# Patient Record
Sex: Female | Born: 1955 | Race: White | Hispanic: No | Smoking: Never smoker
Health system: Southern US, Community
[De-identification: ages and names within clinical notes are randomized; demographics above are authoritative.]

## PROBLEM LIST (undated history)

## (undated) DIAGNOSIS — N3010 Interstitial cystitis (chronic) without hematuria: Secondary | ICD-10-CM

## (undated) DIAGNOSIS — E785 Hyperlipidemia, unspecified: Secondary | ICD-10-CM

## (undated) DIAGNOSIS — T8859XA Other complications of anesthesia, initial encounter: Secondary | ICD-10-CM

## (undated) DIAGNOSIS — Z9889 Other specified postprocedural states: Secondary | ICD-10-CM

## (undated) DIAGNOSIS — M1990 Unspecified osteoarthritis, unspecified site: Secondary | ICD-10-CM

## (undated) DIAGNOSIS — R112 Nausea with vomiting, unspecified: Secondary | ICD-10-CM

## (undated) DIAGNOSIS — E039 Hypothyroidism, unspecified: Secondary | ICD-10-CM

## (undated) DIAGNOSIS — R519 Headache, unspecified: Secondary | ICD-10-CM

## (undated) DIAGNOSIS — N3289 Other specified disorders of bladder: Secondary | ICD-10-CM

## (undated) DIAGNOSIS — R42 Dizziness and giddiness: Secondary | ICD-10-CM

## (undated) DIAGNOSIS — T4145XA Adverse effect of unspecified anesthetic, initial encounter: Secondary | ICD-10-CM

## (undated) HISTORY — PX: ABDOMINAL HYSTERECTOMY: SHX81

## (undated) HISTORY — PX: RECTOVAGINAL FISTULA CLOSURE: SUR265

## (undated) HISTORY — PX: APPENDECTOMY: SHX54

## (undated) HISTORY — PX: BLADDER SURGERY: SHX569

## (undated) HISTORY — PX: INTERSTIM IMPLANT PLACEMENT: SHX5130

---
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
## 2005-02-18 ENCOUNTER — Ambulatory Visit (HOSPITAL_BASED_OUTPATIENT_CLINIC_OR_DEPARTMENT_OTHER): Admission: RE | Admit: 2005-02-18 | Discharge: 2005-02-18 | Payer: Self-pay | Admitting: Urology

## 2005-03-04 ENCOUNTER — Ambulatory Visit (HOSPITAL_BASED_OUTPATIENT_CLINIC_OR_DEPARTMENT_OTHER): Admission: RE | Admit: 2005-03-04 | Discharge: 2005-03-04 | Payer: Self-pay | Admitting: Urology

## 2014-01-02 ENCOUNTER — Ambulatory Visit: Payer: Self-pay

## 2014-01-02 LAB — URINALYSIS, COMPLETE
BILIRUBIN, UR: NEGATIVE
Blood: NEGATIVE
GLUCOSE, UR: NEGATIVE mg/dL (ref 0–75)
KETONE: NEGATIVE
NITRITE: NEGATIVE
Ph: 6 (ref 4.5–8.0)
Protein: NEGATIVE
RBC,UR: NONE SEEN /HPF (ref 0–5)
Specific Gravity: 1.025 (ref 1.003–1.030)

## 2015-02-08 ENCOUNTER — Ambulatory Visit: Payer: Self-pay | Admitting: Family Medicine

## 2015-05-15 ENCOUNTER — Ambulatory Visit
Admission: EM | Admit: 2015-05-15 | Discharge: 2015-05-15 | Disposition: A | Discharge status: Home / Self Care | Payer: No Typology Code available for payment source | Attending: Family Medicine | Admitting: Family Medicine

## 2015-05-15 DIAGNOSIS — R21 Rash and other nonspecific skin eruption: Secondary | ICD-10-CM

## 2015-05-15 DIAGNOSIS — I776 Arteritis, unspecified: Secondary | ICD-10-CM

## 2015-05-15 HISTORY — DX: Other specified disorders of bladder: N32.89

## 2015-05-15 HISTORY — DX: Hyperlipidemia, unspecified: E78.5

## 2015-05-15 MED ORDER — RANITIDINE HCL 150 MG PO CAPS: 150.0000 mg | ORAL_CAPSULE | Freq: Two times a day (BID) | ORAL | Status: DC

## 2015-05-15 MED ORDER — LORATADINE 10 MG PO TABS: 10.0000 mg | ORAL_TABLET | Freq: Every day | ORAL | Status: DC

## 2015-05-15 MED ORDER — METHYLPREDNISOLONE SODIUM SUCC 125 MG IJ SOLR
125.0000 mg | Freq: Once | INTRAMUSCULAR | Status: AC
  Administered 2015-05-15: 125 mg via INTRAMUSCULAR

## 2015-05-15 MED ORDER — CETIRIZINE HCL 10 MG PO TABS: 10.0000 mg | ORAL_TABLET | Freq: Every day | ORAL | Status: DC

## 2015-05-15 MED ORDER — PREDNISONE 10 MG (21) PO TBPK: ORAL_TABLET | ORAL | Status: DC

## 2015-05-15 NOTE — ED Provider Notes (Signed)
CSN: [id number]     Arrival date & time 05/15/15  1753 History   First MD Initiated Contact with Patient 05/15/15 1919     Chief Complaint  Patient presents with  . Rash   (Consider location/radiation/quality/duration/timing/severity/associated sxs/prior Treatment) Patient is a 59 y.o. female presenting with rash. The history is provided by the patient. No language interpreter was used.  Rash Location:  Torso and shoulder/arm Torso rash location:  Abd LLQ, abd RLQ, R flank and R chest Quality: bruising, itchiness and redness   Severity:  Severe Onset quality:  Sudden Duration:  3 days Progression:  Worsening Chronicity:  New Context: medications   Relieved by:  Nothing Ineffective treatments:  Antihistamines Associated symptoms: joint pain   Associated symptoms: no fatigue, no fever, no myalgias and no shortness of breath    She reports being diagnosed with shingles on May 3. Sounds like she was placed on acyclovir since it's taken 4 times a day and she missed a few doses a lot of taking it for about 12 days. She just got through with acyclovir on the 14th. She states the shingles-like lesions on her right flank started bothering her about 3 days ago which would be about the 14th but she just thought that there was coming from a late reaction from the shingles. She start itching and scratching her abdomen her chest and her arms and she noticed bruises over her lower abdomen. She is use Benadryl Bacillus Benadryl wears off she has what sounds as if it is a rebound phenomena with more itching that she had before. Past Medical History  Diagnosis Date  . Lipidemia   . Bladder spasms    Past Surgical History  Procedure Laterality Date  . Abdominal hysterectomy     History reviewed. No pertinent family history. History  Substance Use Topics  . Smoking status: Never Smoker   . Smokeless tobacco: Not on file  . Alcohol Use: No   OB History    No data available     Review of  Systems  Constitutional: Negative for fever and fatigue.  Respiratory: Negative for shortness of breath.   Musculoskeletal: Positive for arthralgias. Negative for myalgias.  Skin: Positive for rash.  All other systems reviewed and are negative.   Allergies  Review of patient's allergies indicates no known allergies.  Home Medications   Prior to Admission medications   Medication Sig Start Date End Date Taking? Authorizing Provider  atorvastatin (LIPITOR) 40 MG tablet Take 40 mg by mouth daily.   Yes Historical Provider, MD  zolpidem (AMBIEN) 10 MG tablet Take 10 mg by mouth at bedtime as needed for sleep.   Yes Historical Provider, MD  cetirizine (ZYRTEC) 10 MG tablet Take 1 tablet (10 mg total) by mouth daily. If needed at night for itching not relieved by Claritin in the morning. 05/15/15   Hassan RowanEugene Avy Barlett, MD  loratadine (CLARITIN) 10 MG tablet Take 1 tablet (10 mg total) by mouth daily. Take 1 tablet in the morning. As needed for itching. 05/15/15   Hassan RowanEugene Greysen Devino, MD  predniSONE (STERAPRED UNI-PAK 21 TAB) 10 MG (21) TBPK tablet All pills to be taken orally. 6 tablets day 1, 5 tablets daily 2, 4 tablets day 3, 3 tablets day 4, 2 tablets day 5, 1 tablet day 6 day 05/15/15   Hassan RowanEugene Aylee Littrell, MD  ranitidine (ZANTAC) 150 MG capsule Take 1 capsule (150 mg total) by mouth 2 (two) times daily. 05/15/15   Hassan RowanEugene Adelaine Roppolo, MD  BP 142/80 mmHg  Pulse 78  Temp(Src) 98.3 F (36.8 C) (Tympanic)  Resp 16  Ht 5' (1.524 m)  Wt 200 lb (90.719 kg)  BMI 39.06 kg/m2  SpO2 99%  LMP  Physical Exam  Constitutional: She is oriented to person, place, and time. She appears well-developed and well-nourished.  Obese white female  HENT:  Head: Normocephalic and atraumatic.  Cardiovascular: Normal rate and regular rhythm.   Pulmonary/Chest: Effort normal and breath sounds normal.  Abdominal:  Bruising present over the lower abdomen.  Neurological: She is oriented to person, place, and time.  Skin: Rash noted. There is  erythema.     Vitals reviewed.   ED Course  Procedures (including critical care time) Labs Review Labs Reviewed - No data to display  Imaging Review No results found. Patient looks as if she has a vasculitis. I think the bruising of the abdomen is coming from her scratching the vasculitis.  I have explained to her this may be a reaction to acyclovir or could be some type of late reaction to the shingles. Will treat with a dose of Solu-Medrol here. Prednisone for 6 days H2-blocker Zantac twice a day, H1 blocker Claritin in the a.m. and repeat with Zyrtec if needed.  MDM   1. Vasculitis   2. Rash and nonspecific skin eruption        Hassan RowanEugene Leaman Abe, MD 05/15/15 2231

## 2015-05-15 NOTE — Discharge Instructions (Signed)
Allergies °Allergies may happen from anything your body is sensitive to. This may be food, medicines, pollens, chemicals, and nearly anything around you in everyday life that produces allergens. An allergen is anything that causes an allergy producing substance. Heredity is often a factor in causing these problems. This means you may have some of the same allergies as your parents. °Food allergies happen in all age groups. Food allergies are some of the most severe and life threatening. Some common food allergies are cow's milk, seafood, eggs, nuts, wheat, and soybeans. °SYMPTOMS  °· Swelling around the mouth. °· An itchy red rash or hives. °· Vomiting or diarrhea. °· Difficulty breathing. °SEVERE ALLERGIC REACTIONS ARE LIFE-THREATENING. °This reaction is called anaphylaxis. It can cause the mouth and throat to swell and cause difficulty with breathing and swallowing. In severe reactions only a trace amount of food (for example, peanut oil in a salad) may cause death within seconds. °Seasonal allergies occur in all age groups. These are seasonal because they usually occur during the same season every year. They may be a reaction to molds, grass pollens, or tree pollens. Other causes of problems are house dust mite allergens, pet dander, and mold spores. The symptoms often consist of nasal congestion, a runny itchy nose associated with sneezing, and tearing itchy eyes. There is often an associated itching of the mouth and ears. The problems happen when you come in contact with pollens and other allergens. Allergens are the particles in the air that the body reacts to with an allergic reaction. This causes you to release allergic antibodies. Through a chain of events, these eventually cause you to release histamine into the blood stream. Although it is meant to be protective to the body, it is this release that causes your discomfort. This is why you were given anti-histamines to feel better.  If you are unable to  pinpoint the offending allergen, it may be determined by skin or blood testing. Allergies cannot be cured but can be controlled with medicine. °Hay fever is a collection of all or some of the seasonal allergy problems. It may often be treated with simple over-the-counter medicine such as diphenhydramine. Take medicine as directed. Do not drink alcohol or drive while taking this medicine. Check with your caregiver or package insert for child dosages. °If these medicines are not effective, there are many new medicines your caregiver can prescribe. Stronger medicine such as nasal spray, eye drops, and corticosteroids may be used if the first things you try do not work well. Other treatments such as immunotherapy or desensitizing injections can be used if all else fails. Follow up with your caregiver if problems continue. These seasonal allergies are usually not life threatening. They are generally more of a nuisance that can often be handled using medicine. °HOME CARE INSTRUCTIONS  °· If unsure what causes a reaction, keep a diary of foods eaten and symptoms that follow. Avoid foods that cause reactions. °· If hives or rash are present: °¨ Take medicine as directed. °¨ You may use an over-the-counter antihistamine (diphenhydramine) for hives and itching as needed. °¨ Apply cold compresses (cloths) to the skin or take baths in cool water. Avoid hot baths or showers. Heat will make a rash and itching worse. °· If you are severely allergic: °¨ Following a treatment for a severe reaction, hospitalization is often required for closer follow-up. °¨ Wear a medic-alert bracelet or necklace stating the allergy. °¨ You and your family must learn how to give adrenaline or use   an anaphylaxis kit.  If you have had a severe reaction, always carry your anaphylaxis kit or EpiPen with you. Use this medicine as directed by your caregiver if a severe reaction is occurring. Failure to do so could have a fatal outcome. SEEK MEDICAL  CARE IF:  You suspect a food allergy. Symptoms generally happen within 30 minutes of eating a food.  Your symptoms have not gone away within 2 days or are getting worse.  You develop new symptoms.  You want to retest yourself or your child with a food or drink you think causes an allergic reaction. Never do this if an anaphylactic reaction to that food or drink has happened before. Only do this under the care of a caregiver. SEEK IMMEDIATE MEDICAL CARE IF:   You have difficulty breathing, are wheezing, or have a tight feeling in your chest or throat.  You have a swollen mouth, or you have hives, swelling, or itching all over your body.  You have had a severe reaction that has responded to your anaphylaxis kit or an EpiPen. These reactions may return when the medicine has worn off. These reactions should be considered life threatening. MAKE SURE YOU:   Understand these instructions.  Will watch your condition.  Will get help right away if you are not doing well or get worse. Document Released: 03/10/2003 Document Revised: 04/11/2013 Document Reviewed: 08/14/2008 [facility] Patient Information 2015 [place], [place]. This information is not intended to replace advice given to you by your health care provider. Make sure you discuss any questions you have with your health care provider.  Vasculitis Vasculitis is when your blood vessels are inflamed. There are many different blood vessels in the body, and vasculitis can affect any of them. This includes large (veins and arteries) and small (capillaries) vessels. With vasculitis,   Blood vessel walls can become thick.  Blood vessels can become narrow.  Blood vessels can become weak. Sometimes, it becomes so weak that the blood vessel bulges out like a balloon. This is called an aneurysm. Aneurysms are rare but can be life-threatening.  Scarring can occur.  Not enough blood can flow through the blood vessels. All of these things can  damage many parts of the body, including the muscles, kidneys, lungs and brain. There are many types of vasculitis. Some types are short-term (acute), while others are long-term (chronic). Some types may go away without treatment, and others may need to be treated for a long time.  CAUSES  Vasculitis occurs when the body's immune system (which fights germs and disease) makes a mistake. It attacks its own blood vessels. This causes inflammation (the body's way of reacting to injury or infection).   Why this happens is usually not known. The condition is then called primary vasculitis.  Sometimes, something triggers the inflammation. This is called secondary vasculitis. Possible causes include:  Infections.  An immune system disease. Examples include lupus, rheumatoid arthritis and scleroderma.  An allergic reaction to a medicine.  Cancer that affects blood cells. This includes leukemia and lymphoma.  Males and females of all ages and races can develop vasculitis. Some risk factors make vasculitis more likely. These include:  Smoking.  Stress.  Physical injury. SYMPTOMS  There are more than 20 types of vasculitis. Symptoms of each type vary, but some symptoms are common.  Many people with vasculitis:  Have a fever.  Do not feel like eating.  Lose weight.  Feel very tired.  Have aches and pains.  Feel weak.  Start to not have feeling (numbness) in an area.  Symptoms for some types of vasculitis also could be:  Sores in the mouth or eyes.  Skin problems. This could be sores, spots or rashes.  Trouble seeing.  Trouble breathing.  Blood in the urine.  Headaches.  Pain in the abdomen.  Stuffy or bloody nose. DIAGNOSIS  Vasculitis symptoms are similar to symptoms of many other conditions. That can make it hard to tell if you have vasculitis. To be sure, your caregiver will ask about your symptoms and do a physical exam. Certain tests may be necessary, such as:    A complete blood count (CBC). This test shows how many red blood cells are in your blood. Not having enough red blood cells (anemic) can result from vasculitis.  Erythrocyte sedimentation (also called sed rate test). It measures inflammation in the body.  C reactive protein (CRP). This also shows if there is inflammation.  Anti-neutrophil cytoplasmic antibodies (ANCA). This can tell if the immune system is reacting to certain cells in the blood.  A urine test. This checks for blood or protein in the urine. That could be a sign of kidney damage from vasculitis.  Imaging tests. These tests create pictures from inside the body. Options include:  X-rays.  Computed tomography (CT) scan. This uses X-rays guided by a computer.  Ultrasound. It creates an image using sound waves.  Magnetic resonance imaging (MRI). It uses radio waves, magnets and a computer.  Angiography. A dye is put into your blood vessels. Then, an X-ray is taken of them.  A biopsy of a blood vessel. This means your caregiver will take out a small piece of a blood vessel. Then, it is checked under a microscope. This is an important test. It often is the best way to know for sure if you have vasculitis. TREATMENT   Treatment will depend on the type of vasculitis and how severe it is. Often, you will need to see a specialist in immunologic diseases (rheumatologist).  Some types of vasculitis may go away without treatment.  Some types need only over-the-counter drugs.  Prescription medicines are used to treat many types of vasculitis. For example:  Corticosteroids. These are the drugs used most often. They are very powerful. Usually, a high dose is taken until symptoms improve. Then, the dose is gradually decreased. Using corticosteroids for a long time can cause problems. They can make muscles and bones weak. They can cause blood pressure to go up, and cause diabetes. Also, people often gain weight when they take  corticosteroids.  Cytotoxic drugs. These kill cells that cause inflammation. Sometimes, they are used if corticosteroids do not help. Other times, both medications are taken.  Surgery. This may be needed to repair a blood vessel that has bulged out (aneurysm).  Treatment can sometimes cure your disease. Other times, it can put the disease in remission (no symptoms). Increased treatment and reevaluation might be necessary if your disease comes back or flares. HOME CARE INSTRUCTIONS   Take any medications that your caregiver prescribes. Follow the directions carefully.  Watch for any problems that can be caused by a drug (side effects). Tell your caregiver right away if you notice any changes or problems.  Keep all appointments for checkups. This is important to help your caregiver watch for side effects. Checkups may include:  Periodic blood tests.  Bone density testing. This checks how strong or weak your bones are.  Blood pressure checks. If your blood pressure rises,  you may need to take a drug to control it while you are taking corticosteroids.  Blood sugar checks. This is to be sure you are not developing diabetes. If you have diabetes, corticosteroid medications may make it worse and require increased treatment.  Exercise. First, talk with your caregiver about what would be OK for you to do. Aerobic exercise (which increases your heart rate) is often suggested. It includes walking. This type of exercise is good because it helps prevent bone loss. It also helps control your blood pressure.  Follow a healthy diet. Include good sources of protein in your diet. Also include fruits, vegetables and whole grains. Your caregiver can refer you to an expert on healthy eating (dietitian) for more detailed advice.  Learn as much as you can about vasculitis. Understanding your condition can help you cope with it. Coping can be hard because this may be something you will have to live with for  years.  Consider joining a support group. It often helps to talk about your worries with others who have the same problems.  Tell your caregiver if you feel stressed, anxious or depressed. Your caregiver may refer you to a specialist, or recommend medication to relieve your symptoms. SEEK MEDICAL CARE IF:   The symptoms that led to your diagnosis return.  You develop worsening fever, fatigue, headache, weight loss or pain in your jaw.  You develop signs of infection. Infections can be worse if you are on corticosteroid medication.  You develop any new or unexplained symptoms of disease. SEEK IMMEDIATE MEDICAL CARE IF:   Your eyesight changes.  Pain does not go away, even after taking medication.  You feel pain in your chest or abdomen.  You have trouble breathing.  One side of your face or body becomes suddenly weak or numb.  Your nose bleeds.  There is blood in your urine.  You develop a fever of more than 102 F (38.9 C). Document Released: 10/11/2009 Document Revised: 03/08/2012 Document Reviewed: 02/08/2014 [facility] Patient Information 2015 [place], [place]. This information is not intended to replace advice given to you by your health care provider. Make sure you discuss any questions you have with your health care provider.

## 2015-05-15 NOTE — ED Notes (Signed)
Pt states "I was treated for shingles 3 weeks ago, I completed course of Valtrex and now 4 days I have this rash all over, it is very itchy. I have changed to all skin sensitive soaps, but the rash continues to get worse."

## 2016-08-16 ENCOUNTER — Ambulatory Visit
Admission: EM | Admit: 2016-08-16 | Discharge: 2016-08-16 | Disposition: A | Discharge status: Home / Self Care | Payer: Commercial Managed Care - PPO | Attending: Family Medicine | Admitting: Family Medicine

## 2016-08-16 ENCOUNTER — Ambulatory Visit (INDEPENDENT_AMBULATORY_CARE_PROVIDER_SITE_OTHER): Payer: Commercial Managed Care - PPO

## 2016-08-16 DIAGNOSIS — S92901A Unspecified fracture of right foot, initial encounter for closed fracture: Secondary | ICD-10-CM | POA: Diagnosis not present

## 2016-08-16 MED ORDER — OXYCODONE-ACETAMINOPHEN 5-325 MG PO TABS: 1.0000 | ORAL_TABLET | Freq: Four times a day (QID) | ORAL | 0 refills | Status: DC | PRN

## 2016-08-16 NOTE — ED Triage Notes (Signed)
Patient complains of right foot pain. Patient states that she was taking her dogs outside around midnight and overturned her foot on a paver outside of her house. Patient states that she now has right foot pain that is described as sharp.

## 2016-08-16 NOTE — Discharge Instructions (Signed)
Patient will need to follow-up with podiatry or orthopedic foot/ankle.

## 2016-08-16 NOTE — ED Provider Notes (Signed)
[facility]    CSN: [id number] Arrival date & time: 08/16/16  [id number]  First Provider Contact:  None       History   Chief Complaint Chief Complaint  Patient presents with  . Foot Pain    Right    HPI Stacey Parkeratricia Henson Orvan Henson is a 60 y.o. female.   HPI: Patient presents today with right foot pain. Patient states that she was walking her dog when she had an inversion injury to her foot. She denies any other injury. She denies any history of fracture to her right foot in the past. Patient denies any ankle pain.  Past Medical History:  Diagnosis Date  . Bladder spasms   . Lipidemia     There are no active problems to display for this patient.   Past Surgical History:  Procedure Laterality Date  . ABDOMINAL HYSTERECTOMY      OB History    No data available       Home Medications    Prior to Admission medications   Medication Sig Start Date End Date Taking? Authorizing Provider  atorvastatin (LIPITOR) 40 MG tablet Take 40 mg by mouth daily.   Yes Historical Provider, MD  cetirizine (ZYRTEC) 10 MG tablet Take 1 tablet (10 mg total) by mouth daily. If needed at night for itching not relieved by Claritin in the morning. 05/15/15  Yes Hassan RowanEugene Wade, MD  levothyroxine (SYNTHROID, LEVOTHROID) 50 MCG tablet Take 50 mcg by mouth daily before breakfast.   Yes Historical Provider, MD  loratadine (CLARITIN) 10 MG tablet Take 1 tablet (10 mg total) by mouth daily. Take 1 tablet in the morning. As needed for itching. 05/15/15  Yes Hassan RowanEugene Wade, MD  ranitidine (ZANTAC) 150 MG capsule Take 1 capsule (150 mg total) by mouth 2 (two) times daily. 05/15/15  Yes Hassan RowanEugene Wade, MD  zolpidem (AMBIEN) 10 MG tablet Take 10 mg by mouth at bedtime as needed for sleep.   Yes Historical Provider, MD  predniSONE (STERAPRED UNI-PAK 21 TAB) 10 MG (21) TBPK tablet All pills to be taken orally. 6 tablets day 1, 5 tablets daily 2, 4 tablets day 3, 3 tablets day 4, 2 tablets day 5, 1 tablet day 6 day  05/15/15   Hassan RowanEugene Wade, MD    Family History Family History  Problem Relation Age of Onset  . Cancer Mother     Social History Social History  Substance Use Topics  . Smoking status: Never Smoker  . Smokeless tobacco: Never Used  . Alcohol use No     Allergies   Review of patient's allergies indicates no known allergies.   Review of Systems Review of Systems   Physical Exam Triage Vital Signs ED Triage Vitals  Enc Vitals Group     BP 08/16/16 0855 123/88     Pulse Rate 08/16/16 0855 100     Resp 08/16/16 0855 16     Temp 08/16/16 0855 97.5 F (36.4 C)     Temp Source 08/16/16 0855 Tympanic     SpO2 08/16/16 0855 97 %     Weight 08/16/16 0856 210 lb (95.3 kg)     Height 08/16/16 0856 5' (1.524 m)     Head Circumference --      Peak Flow --      Pain Score 08/16/16 0859 8     Pain Loc --      Pain Edu? --      Excl. in GC? --    No  data found.   Updated Vital Signs BP 123/88 (BP Location: Left Arm)   Pulse 100   Temp 97.5 F (36.4 C) (Tympanic)   Resp 16   Ht 5' (1.524 m)   Wt 210 lb (95.3 kg)   SpO2 97%   BMI 41.01 kg/m      Physical Exam:  GENERAL: NAD RESP: CTA B CARD: RRR MSK: Right Foot- moderate swelling of the dorsal aspect of the foot, there is some mild ecchymosis, there is tenderness at the distal aspect of the foot, there is some tenderness along the arch of the foot as well, there is no tenderness at the insertion of the plantar fascia at the calcaneus, decreased range of motion due to swelling and discomfort, NV intact NEURO: CN II-XII groslly intact    UC Treatments / Results  Labs (all labs ordered are listed, but only abnormal results are displayed) Labs Reviewed - No data to display  EKG  EKG Interpretation None       Radiology No results found.  Procedures Procedures (including critical care time)  Medications Ordered in UC Medications - No data to display   Initial Impression / Assessment and Plan / UC Course   I have reviewed the triage vital signs and the nursing notes.  Pertinent labs & imaging results that were available during my care of the patient were reviewed by me and considered in my medical decision making (see chart for details).  Clinical Course  A/P: Right foot fracture- discussed results with patient, will place patient in walking boot and crutches, patient will need to follow-up with podiatry and or orthopedic foot and ankle. Patient encouraged on rest, ice, elevation, compression. NSAIDs when necessary, Percocet when necessary severe pain.  Final Clinical Impressions(s) / UC Diagnoses   Final diagnoses:  None    New Prescriptions New Prescriptions   No medications on file     Jolene ProvostKirtida Buddie Marston, MD 08/16/16 1023

## 2019-02-01 ENCOUNTER — Ambulatory Visit
Admission: EM | Admit: 2019-02-01 | Discharge: 2019-02-01 | Disposition: A | Discharge status: Home / Self Care | Payer: Commercial Managed Care - PPO | Attending: Family Medicine | Admitting: Family Medicine

## 2019-02-01 DIAGNOSIS — J0110 Acute frontal sinusitis, unspecified: Secondary | ICD-10-CM | POA: Diagnosis not present

## 2019-02-01 MED ORDER — AMOXICILLIN-POT CLAVULANATE 875-125 MG PO TABS: 1.0000 | ORAL_TABLET | Freq: Two times a day (BID) | ORAL | 0 refills | Status: DC

## 2019-02-01 NOTE — ED Triage Notes (Signed)
Pt states she thinks she has a sinus infection for the past 2 weeks. Cannot lay flat due to the pressure in her face and head. Also states drainage in the back of her throat but no nasal discharge. Has been taking otc cold medication without relief.

## 2019-02-01 NOTE — ED Provider Notes (Signed)
[facility]    CSN: [id number] Arrival date & time: 02/01/19  1713  History   Chief Complaint Chief Complaint  Patient presents with  . Sinusitis   HPI  63 year old female presents with concerns for sinusitis.   2 week history of sinus pressure, headache, congestion.  Located in the frontal region.  Associated postnasal drip.  She has used over-the-counter cold and sinus medication without improvement.  No known exacerbating factors.  No fever.  No other associated symptoms.  No other complaints.  PMH, Surgical Hx, Family Hx, Social History reviewed and updated as below.  PMH:  Interstitial cystitis Migraine Insomnia Hyperlipidemia  Past Surgical History:  Procedure Laterality Date  . ABDOMINAL HYSTERECTOMY     OB History   No obstetric history on file.    Home Medications    Prior to Admission medications   Medication Sig Start Date End Date Taking? Authorizing Provider  amitriptyline (ELAVIL) 50 MG tablet  01/25/19  Yes [provider]  atorvastatin (LIPITOR) 40 MG tablet Take 40 mg by mouth daily.   Yes [provider]  cetirizine (ZYRTEC) 10 MG tablet Take 1 tablet (10 mg total) by mouth daily. If needed at night for itching not relieved by Claritin in the morning. 05/15/15  Yes Hassan Rowan, MD  levothyroxine (SYNTHROID, LEVOTHROID) 50 MCG tablet Take 50 mcg by mouth daily before breakfast.   Yes [provider]  loratadine (CLARITIN) 10 MG tablet Take 1 tablet (10 mg total) by mouth daily. Take 1 tablet in the morning. As needed for itching. 05/15/15  Yes Hassan Rowan, MD  zolpidem (AMBIEN) 10 MG tablet Take 10 mg by mouth at bedtime as needed for sleep.   Yes [provider]  amoxicillin-clavulanate (AUGMENTIN) 875-125 MG tablet Take 1 tablet by mouth every 12 (twelve) hours. 02/01/19   Tommie Sams, DO  oxyCODONE-acetaminophen (PERCOCET/ROXICET) 5-325 MG tablet Take 1 tablet by mouth every 6 (six) hours as needed for  severe pain. 08/16/16   Jolene Provost, MD  ranitidine (ZANTAC) 150 MG capsule Take 1 capsule (150 mg total) by mouth 2 (two) times daily. 05/15/15   Hassan Rowan, MD   Family History Family History  Problem Relation Age of Onset  . Cancer Mother    Social History Social History   Tobacco Use  . Smoking status: Never Smoker  . Smokeless tobacco: Never Used  Substance Use Topics  . Alcohol use: No  . Drug use: No   Allergies   Patient has no known allergies.  Review of Systems Review of Systems  Constitutional: Negative for fever.  HENT: Positive for congestion, postnasal drip, sinus pressure and sinus pain.    Physical Exam Triage Vital Signs ED Triage Vitals  Enc Vitals Group     BP 02/01/19 1803 129/73     Pulse Rate 02/01/19 1803 93     Resp 02/01/19 1803 18     Temp 02/01/19 1803 98.2 F (36.8 C)     Temp Source 02/01/19 1803 Oral     SpO2 02/01/19 1803 98 %     Weight 02/01/19 1804 230 lb (104.3 kg)     Height 02/01/19 1804 5' (1.524 m)     Head Circumference --      Peak Flow --      Pain Score 02/01/19 1804 5     Pain Loc --      Pain Edu? --      Excl. in GC? --  Updated Vital Signs BP 129/73 (BP Location: Left Arm)   Pulse 93   Temp 98.2 F (36.8 C) (Oral)   Resp 18   Ht 5' (1.524 m)   Wt 104.3 kg   SpO2 98%   BMI 44.92 kg/m   Visual Acuity Right Eye Distance:   Left Eye Distance:   Bilateral Distance:    Right Eye Near:   Left Eye Near:    Bilateral Near:     Physical Exam Vitals signs and nursing note reviewed.  Constitutional:      Appearance: Normal appearance.  HENT:     Head: Normocephalic and atraumatic.     Right Ear: Tympanic membrane normal.     Left Ear: Tympanic membrane normal.     Nose: Nose normal.     Comments: Frontal sinus tenderness to palpation.    Mouth/Throat:     Pharynx: Oropharynx is clear. No posterior oropharyngeal erythema.  Eyes:     General:        Right eye: No discharge.        Left eye: No  discharge.     Conjunctiva/sclera: Conjunctivae normal.  Cardiovascular:     Rate and Rhythm: Normal rate and regular rhythm.  Pulmonary:     Effort: Pulmonary effort is normal.     Breath sounds: Normal breath sounds.  Neurological:     Mental Status: She is alert.  Psychiatric:        Mood and Affect: Mood normal.        Behavior: Behavior normal.    UC Treatments / Results  Labs (all labs ordered are listed, but only abnormal results are displayed) Labs Reviewed - No data to display  EKG None  Radiology No results found.  Procedures Procedures (including critical care time)  Medications Ordered in UC Medications - No data to display  Initial Impression / Assessment and Plan / UC Course  I have reviewed the triage vital signs and the nursing notes.  Pertinent labs & imaging results that were available during my care of the patient were reviewed by me and considered in my medical decision making (see chart for details).    63 year old female presents with sinusitis.  Treating with Augmentin.  Final Clinical Impressions(s) / UC Diagnoses   Final diagnoses:  Acute frontal sinusitis, recurrence not specified   Discharge Instructions   None    ED Prescriptions    Medication Sig Dispense Auth. Provider   amoxicillin-clavulanate (AUGMENTIN) 875-125 MG tablet Take 1 tablet by mouth every 12 (twelve) hours. 14 tablet Tommie Samsook, Rosette Bellavance G, DO     Controlled Substance Prescriptions [place] Controlled Substance Registry consulted? Not Applicable   Tommie SamsCook, Syanna Remmert G, DO 02/01/19 1959

## 2019-04-16 ENCOUNTER — Ambulatory Visit
Admission: EM | Admit: 2019-04-16 | Discharge: 2019-04-16 | Disposition: A | Discharge status: Home / Self Care | Payer: Commercial Managed Care - PPO | Attending: Family Medicine | Admitting: Family Medicine

## 2019-04-16 ENCOUNTER — Ambulatory Visit (INDEPENDENT_AMBULATORY_CARE_PROVIDER_SITE_OTHER): Payer: Commercial Managed Care - PPO

## 2019-04-16 DIAGNOSIS — R0789 Other chest pain: Secondary | ICD-10-CM

## 2019-04-16 DIAGNOSIS — R0602 Shortness of breath: Secondary | ICD-10-CM

## 2019-04-16 DIAGNOSIS — S2232XA Fracture of one rib, left side, initial encounter for closed fracture: Secondary | ICD-10-CM

## 2019-04-16 MED ORDER — HYDROCODONE-ACETAMINOPHEN 5-325 MG PO TABS: 1.0000 | ORAL_TABLET | Freq: Three times a day (TID) | ORAL | 0 refills | Status: DC | PRN
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
## 2019-04-16 NOTE — Discharge Instructions (Signed)
Rest.  Pain medication as needed.  Take care  Dr. Lawren Sexson  

## 2020-03-23 ENCOUNTER — Other Ambulatory Visit: Payer: Self-pay | Admitting: Orthopedic Surgery

## 2020-03-23 DIAGNOSIS — M1712 Unilateral primary osteoarthritis, left knee: Secondary | ICD-10-CM

## 2020-04-02 ENCOUNTER — Ambulatory Visit
Admission: RE | Admit: 2020-04-02 | Discharge: 2020-04-02 | Disposition: A | Discharge status: Home / Self Care | Payer: Commercial Managed Care - PPO | Source: Ambulatory Visit | Attending: Orthopedic Surgery | Admitting: Orthopedic Surgery

## 2020-04-02 ENCOUNTER — Other Ambulatory Visit: Payer: Self-pay

## 2020-04-02 DIAGNOSIS — M1712 Unilateral primary osteoarthritis, left knee: Secondary | ICD-10-CM

## 2020-05-08 ENCOUNTER — Other Ambulatory Visit: Payer: Self-pay | Admitting: Orthopedic Surgery

## 2020-05-16 ENCOUNTER — Other Ambulatory Visit: Payer: Self-pay

## 2020-05-16 ENCOUNTER — Encounter
Admission: RE | Admit: 2020-05-16 | Discharge: 2020-05-16 | Disposition: A | Discharge status: Home / Self Care | Payer: Commercial Managed Care - PPO | Source: Ambulatory Visit | Attending: Orthopedic Surgery | Admitting: Orthopedic Surgery

## 2020-05-16 DIAGNOSIS — Z01818 Encounter for other preprocedural examination: Secondary | ICD-10-CM | POA: Insufficient documentation

## 2020-05-16 HISTORY — DX: Dizziness and giddiness: R42

## 2020-05-16 HISTORY — DX: Other specified postprocedural states: R11.2

## 2020-05-16 HISTORY — DX: Hypothyroidism, unspecified: E03.9

## 2020-05-16 HISTORY — DX: Unspecified osteoarthritis, unspecified site: M19.90

## 2020-05-16 HISTORY — DX: Interstitial cystitis (chronic) without hematuria: N30.10

## 2020-05-16 HISTORY — DX: Other complications of anesthesia, initial encounter: T88.59XA

## 2020-05-16 HISTORY — DX: Headache, unspecified: R51.9

## 2020-05-16 HISTORY — DX: Nausea with vomiting, unspecified: Z98.890

## 2020-05-16 NOTE — Patient Instructions (Signed)
Your procedure is scheduled on: 05-24-20 THURSDAY Report to Same Day Surgery 2nd floor medical mall [facility] Entrance-take elevator on left to 2nd floor.  Check in with surgery information desk.) To find out your arrival time please call [phone] between 1PM - 3PM on 05-23-20 [facility]  Remember: Instructions that are not followed completely may result in serious medical risk, up to and including death, or upon the discretion of your surgeon and anesthesiologist your surgery may need to be rescheduled.    _x___ 1. Do not eat food after midnight the night before your procedure. NO GUM OR CANDY AFTER MIDNIGHT. You may drink clear liquids up to 2 hours before you are scheduled to arrive at the hospital for your procedure.  Do not drink clear liquids within 2 hours of your scheduled arrival to the hospital.  Clear liquids include  --Water or Apple juice without pulp  --Gatorade  --Black Coffee or Clear Tea (No milk, no creamers, do not add anything to the coffee or Tea-sugar ok to add)   ____Ensure clear carbohydrate drink on the way to the hospital for bariatric patients  _X___Ensure clear carbohydrate drink-FINISH DRINK 2 HOURS PRIOR TO ARRIVAL TIME TO HOSPITAL DAY OF SURGERY    __x__ 2. No Alcohol for 24 hours before or after surgery.   __x__3. No Smoking or e-cigarettes for 24 prior to surgery.  Do not use any chewable tobacco products for at least 6 hour prior to surgery   ____  4. Bring all medications with you on the day of surgery if instructed.    __x__ 5. Notify your doctor if there is any change in your medical condition     (cold, fever, infections).    x___6. On the morning of surgery brush your teeth with toothpaste and water.  You may rinse your mouth with mouth wash if you wish.  Do not swallow any toothpaste or mouthwash.   Do not wear jewelry, make-up, hairpins, clips or nail polish.  Do not wear lotions, powders, or perfumes.  Do not shave 48 hours prior to  surgery. Men may shave face and neck.  Do not bring valuables to the hospital.    [facility] is not responsible for any belongings or valuables.               Contacts, dentures or bridgework may not be worn into surgery.  Leave your suitcase in the car. After surgery it may be brought to your room.  For patients admitted to the hospital, discharge time is determined by your treatment team.  _  Patients discharged the day of surgery will not be allowed to drive home.  You will need someone to drive you home and stay with you the night of your procedure.    Please read over the following fact sheets that you were given:   [facility] Preparing for Surgery and MRSA Information/INCENTIVE SPIROMETER INSTRUCTIONS  _x___ TAKE THE FOLLOWING MEDICATION THE MORNING OF SURGERY WITH A SMALL SIP OF WATER. These include:  1. SYNTHROID (LEVOTHYROXINE)  2. LIPITOR (ATORVASTATIN)  3.  4.  5.  6.  ____Fleets enema or Magnesium Citrate as directed.   _x___ Use CHG Soap or sage wipes as directed on instruction sheet   ____ Use inhalers on the day of surgery and bring to hospital day of surgery  ____ Stop Metformin and Janumet 2 days prior to surgery.    ____ Take 1/2 of usual insulin dose the night before surgery and  none on the morning surgery.   ____ Follow recommendations from Cardiologist, Pulmonologist or PCP regarding stopping Aspirin, Coumadin, Plavix ,Eliquis, Effient, or Pradaxa, and Pletal.  X____Stop Anti-inflammatories such as Advil, Aleve, Ibuprofen, Motrin, Naproxen, Naprosyn, Goodies powders or aspirin products NOW-OK to take Tylenol   ____ Stop supplements until after surgery.   ____ Bring C-Pap to the hospital.

## 2020-05-18 ENCOUNTER — Other Ambulatory Visit: Payer: Self-pay

## 2020-05-18 ENCOUNTER — Encounter
Admission: RE | Admit: 2020-05-18 | Discharge: 2020-05-18 | Disposition: A | Discharge status: Home / Self Care | Payer: Commercial Managed Care - PPO | Source: Ambulatory Visit | Attending: Orthopedic Surgery | Admitting: Orthopedic Surgery

## 2020-05-18 DIAGNOSIS — Z01818 Encounter for other preprocedural examination: Secondary | ICD-10-CM | POA: Diagnosis not present

## 2020-05-18 LAB — SURGICAL PCR SCREEN
MRSA, PCR: NEGATIVE
Staphylococcus aureus: NEGATIVE

## 2020-05-18 LAB — CBC WITH DIFFERENTIAL/PLATELET
Abs Immature Granulocytes: 0.01 10*3/uL (ref 0.00–0.07)
Basophils Absolute: 0.1 10*3/uL (ref 0.0–0.1)
Basophils Relative: 1 %
Eosinophils Absolute: 0.2 10*3/uL (ref 0.0–0.5)
Eosinophils Relative: 3 %
HCT: 40.2 % (ref 36.0–46.0)
Hemoglobin: 13.3 g/dL (ref 12.0–15.0)
Immature Granulocytes: 0 %
Lymphocytes Relative: 28 %
Lymphs Abs: 1.9 10*3/uL (ref 0.7–4.0)
MCH: 27.9 pg (ref 26.0–34.0)
MCHC: 33.1 g/dL (ref 30.0–36.0)
MCV: 84.3 fL (ref 80.0–100.0)
Monocytes Absolute: 0.4 10*3/uL (ref 0.1–1.0)
Monocytes Relative: 5 %
Neutro Abs: 4.2 10*3/uL (ref 1.7–7.7)
Neutrophils Relative %: 63 %
Platelets: 253 10*3/uL (ref 150–400)
RBC: 4.77 MIL/uL (ref 3.87–5.11)
RDW: 13.9 % (ref 11.5–15.5)
WBC: 6.7 10*3/uL (ref 4.0–10.5)
nRBC: 0 % (ref 0.0–0.2)

## 2020-05-18 LAB — URINALYSIS, ROUTINE W REFLEX MICROSCOPIC
Bilirubin Urine: NEGATIVE
Glucose, UA: NEGATIVE mg/dL
Hgb urine dipstick: NEGATIVE
Ketones, ur: NEGATIVE mg/dL
Nitrite: NEGATIVE
Protein, ur: NEGATIVE mg/dL
Specific Gravity, Urine: 1.005 (ref 1.005–1.030)
pH: 7 (ref 5.0–8.0)

## 2020-05-18 LAB — COMPREHENSIVE METABOLIC PANEL
ALT: 14 U/L (ref 0–44)
AST: 19 U/L (ref 15–41)
Albumin: 4.2 g/dL (ref 3.5–5.0)
Alkaline Phosphatase: 106 U/L (ref 38–126)
Anion gap: 9 (ref 5–15)
BUN: 12 mg/dL (ref 8–23)
CO2: 28 mmol/L (ref 22–32)
Calcium: 9.3 mg/dL (ref 8.9–10.3)
Chloride: 105 mmol/L (ref 98–111)
Creatinine, Ser: 0.62 mg/dL (ref 0.44–1.00)
GFR calc Af Amer: >60 mL/min (ref >60)
GFR calc non Af Amer: >60 mL/min (ref >60)
Glucose, Bld: 96 mg/dL (ref 70–99)
Potassium: 3.9 mmol/L (ref 3.5–5.1)
Sodium: 142 mmol/L (ref 135–145)
Total Bilirubin: 0.6 mg/dL (ref 0.3–1.2)
Total Protein: 7.5 g/dL (ref 6.5–8.1)

## 2020-05-18 LAB — TYPE AND SCREEN
ABO/RH(D): O POS
Antibody Screen: NEGATIVE

## 2020-05-18 NOTE — Pre-Procedure Instructions (Addendum)
SECURE CHAT WITH DR ZAK:  Pt having total knee arthroplasty 5-27 with Menz. Pt has no significant medical history but once again the surgeon ordered Ekg preop and it is showing abnormal (cannot rule out anterior infarct). There is not another ekg for comparison. Does pt need clearance?   No clearance needed from anesthesiology standpoint. Once again if Dr Rosita Kea wants further workup as he ordered an EKG that is his prerogative. thanks

## 2020-05-22 ENCOUNTER — Other Ambulatory Visit
Admission: RE | Admit: 2020-05-22 | Discharge: 2020-05-22 | Disposition: A | Discharge status: Home / Self Care | Payer: Commercial Managed Care - PPO | Source: Ambulatory Visit | Attending: Orthopedic Surgery | Admitting: Orthopedic Surgery

## 2020-05-22 ENCOUNTER — Other Ambulatory Visit: Payer: Self-pay

## 2020-05-22 DIAGNOSIS — Z01812 Encounter for preprocedural laboratory examination: Secondary | ICD-10-CM | POA: Diagnosis present

## 2020-05-22 DIAGNOSIS — Z20822 Contact with and (suspected) exposure to covid-19: Secondary | ICD-10-CM | POA: Insufficient documentation

## 2020-05-22 LAB — SARS CORONAVIRUS 2 (TAT 6-24 HRS): SARS Coronavirus 2: NEGATIVE

## 2020-05-24 ENCOUNTER — Inpatient Hospital Stay: Payer: Commercial Managed Care - PPO | Admitting: Anesthesiology

## 2020-05-24 ENCOUNTER — Other Ambulatory Visit: Payer: Self-pay

## 2020-05-24 ENCOUNTER — Encounter: Payer: Self-pay | Admitting: Orthopedic Surgery

## 2020-05-24 ENCOUNTER — Inpatient Hospital Stay: Payer: Commercial Managed Care - PPO

## 2020-05-24 ENCOUNTER — Encounter
Admission: RE | Disposition: A | Discharge status: Skilled Nursing Facility | Payer: Self-pay | Source: Home / Self Care | Attending: Orthopedic Surgery

## 2020-05-24 ENCOUNTER — Observation Stay
Admission: RE | Admit: 2020-05-24 | Discharge: 2020-05-29 | Disposition: A | Discharge status: Skilled Nursing Facility | Payer: Commercial Managed Care - PPO | Attending: Orthopedic Surgery | Admitting: Orthopedic Surgery

## 2020-05-24 DIAGNOSIS — Z79899 Other long term (current) drug therapy: Secondary | ICD-10-CM | POA: Diagnosis not present

## 2020-05-24 DIAGNOSIS — Z79890 Hormone replacement therapy (postmenopausal): Secondary | ICD-10-CM | POA: Insufficient documentation

## 2020-05-24 DIAGNOSIS — E785 Hyperlipidemia, unspecified: Secondary | ICD-10-CM | POA: Insufficient documentation

## 2020-05-24 DIAGNOSIS — Z20822 Contact with and (suspected) exposure to covid-19: Secondary | ICD-10-CM | POA: Insufficient documentation

## 2020-05-24 DIAGNOSIS — G4700 Insomnia, unspecified: Secondary | ICD-10-CM | POA: Insufficient documentation

## 2020-05-24 DIAGNOSIS — N3010 Interstitial cystitis (chronic) without hematuria: Secondary | ICD-10-CM | POA: Diagnosis not present

## 2020-05-24 DIAGNOSIS — I10 Essential (primary) hypertension: Secondary | ICD-10-CM | POA: Insufficient documentation

## 2020-05-24 DIAGNOSIS — G8918 Other acute postprocedural pain: Secondary | ICD-10-CM

## 2020-05-24 DIAGNOSIS — E039 Hypothyroidism, unspecified: Secondary | ICD-10-CM | POA: Insufficient documentation

## 2020-05-24 DIAGNOSIS — Z8349 Family history of other endocrine, nutritional and metabolic diseases: Secondary | ICD-10-CM | POA: Diagnosis not present

## 2020-05-24 DIAGNOSIS — N3289 Other specified disorders of bladder: Secondary | ICD-10-CM | POA: Diagnosis not present

## 2020-05-24 DIAGNOSIS — Z96652 Presence of left artificial knee joint: Secondary | ICD-10-CM

## 2020-05-24 DIAGNOSIS — M1712 Unilateral primary osteoarthritis, left knee: Principal | ICD-10-CM | POA: Insufficient documentation

## 2020-05-24 HISTORY — PX: TOTAL KNEE ARTHROPLASTY: SHX125

## 2020-05-24 LAB — CBC
HCT: 39.7 % (ref 36.0–46.0)
Hemoglobin: 12.8 g/dL (ref 12.0–15.0)
MCH: 28.1 pg (ref 26.0–34.0)
MCHC: 32.2 g/dL (ref 30.0–36.0)
MCV: 87.1 fL (ref 80.0–100.0)
Platelets: 237 10*3/uL (ref 150–400)
RBC: 4.56 MIL/uL (ref 3.87–5.11)
RDW: 13.6 % (ref 11.5–15.5)
WBC: 9.7 10*3/uL (ref 4.0–10.5)
nRBC: 0 % (ref 0.0–0.2)

## 2020-05-24 LAB — ABO/RH: ABO/RH(D): O POS

## 2020-05-24 LAB — CREATININE, SERUM
Creatinine, Ser: 0.69 mg/dL (ref 0.44–1.00)
GFR calc Af Amer: >60 mL/min (ref >60)
GFR calc non Af Amer: >60 mL/min (ref >60)

## 2020-05-24 SURGERY — ARTHROPLASTY, KNEE, TOTAL
Anesthesia: Spinal | Site: Knee | Laterality: Left

## 2020-05-24 MED ORDER — ACETAMINOPHEN 10 MG/ML IV SOLN
INTRAVENOUS | Status: DC | PRN
  Administered 2020-05-24: 1000 mg via INTRAVENOUS

## 2020-05-24 MED ORDER — LACTATED RINGERS IV SOLN: INTRAVENOUS | Status: DC

## 2020-05-24 MED ORDER — SODIUM CHLORIDE 0.9 % IV SOLN
INTRAVENOUS | Status: DC | PRN
  Administered 2020-05-24: 50 ug/min via INTRAVENOUS

## 2020-05-24 MED ORDER — ACETAMINOPHEN 325 MG PO TABS
325.0000 mg | ORAL_TABLET | Freq: Four times a day (QID) | ORAL | Status: DC | PRN
  Administered 2020-05-25 – 2020-05-26 (×2): 650 mg via ORAL
  Filled 2020-05-24 (×2): qty 2

## 2020-05-24 MED ORDER — PROPOFOL 10 MG/ML IV BOLUS
INTRAVENOUS | Status: AC
  Filled 2020-05-24: qty 20

## 2020-05-24 MED ORDER — BUPIVACAINE HCL (PF) 0.5 % IJ SOLN
INTRAMUSCULAR | Status: DC | PRN
  Administered 2020-05-24: 2.5 mL

## 2020-05-24 MED ORDER — KETOROLAC TROMETHAMINE 30 MG/ML IJ SOLN
INTRAMUSCULAR | Status: AC
  Filled 2020-05-24: qty 1

## 2020-05-24 MED ORDER — PROPOFOL 500 MG/50ML IV EMUL
INTRAVENOUS | Status: AC
  Filled 2020-05-24: qty 50

## 2020-05-24 MED ORDER — OXYCODONE HCL 5 MG PO TABS
ORAL_TABLET | ORAL | Status: AC
  Filled 2020-05-24: qty 1

## 2020-05-24 MED ORDER — SODIUM CHLORIDE FLUSH 0.9 % IV SOLN
INTRAVENOUS | Status: AC
  Filled 2020-05-24: qty 40

## 2020-05-24 MED ORDER — CEFAZOLIN SODIUM-DEXTROSE 2-4 GM/100ML-% IV SOLN
2.0000 g | INTRAVENOUS | Status: AC
  Administered 2020-05-24: 2 g via INTRAVENOUS

## 2020-05-24 MED ORDER — DOCUSATE SODIUM 100 MG PO CAPS
100.0000 mg | ORAL_CAPSULE | Freq: Two times a day (BID) | ORAL | Status: DC
  Administered 2020-05-24 – 2020-05-29 (×10): 100 mg via ORAL
  Filled 2020-05-24 (×11): qty 1

## 2020-05-24 MED ORDER — POTASSIUM 99 MG PO TABS: 99.0000 mg | ORAL_TABLET | Freq: Every day | ORAL | Status: DC

## 2020-05-24 MED ORDER — ACETAMINOPHEN 10 MG/ML IV SOLN
INTRAVENOUS | Status: AC
  Filled 2020-05-24: qty 100

## 2020-05-24 MED ORDER — DARIFENACIN HYDROBROMIDE ER 7.5 MG PO TB24
15.0000 mg | ORAL_TABLET | Freq: Every day | ORAL | Status: DC
  Administered 2020-05-26 – 2020-05-28 (×3): 15 mg via ORAL
  Filled 2020-05-24 (×2): qty 2
  Filled 2020-05-24: qty 1
  Filled 2020-05-24 (×4): qty 2

## 2020-05-24 MED ORDER — MAGNESIUM HYDROXIDE 400 MG/5ML PO SUSP
30.0000 mL | Freq: Every day | ORAL | Status: DC | PRN
  Administered 2020-05-26: 30 mL via ORAL
  Filled 2020-05-24 (×2): qty 30

## 2020-05-24 MED ORDER — MAGNESIUM CITRATE PO SOLN
1.0000 | Freq: Once | ORAL | Status: DC | PRN
  Filled 2020-05-24: qty 296

## 2020-05-24 MED ORDER — ENOXAPARIN SODIUM 30 MG/0.3ML ~~LOC~~ SOLN
30.0000 mg | Freq: Two times a day (BID) | SUBCUTANEOUS | Status: DC
  Administered 2020-05-25 – 2020-05-29 (×9): 30 mg via SUBCUTANEOUS
  Filled 2020-05-24 (×11): qty 0.3

## 2020-05-24 MED ORDER — DEXAMETHASONE SODIUM PHOSPHATE 10 MG/ML IJ SOLN
INTRAMUSCULAR | Status: DC | PRN
  Administered 2020-05-24: 10 mg via INTRAVENOUS

## 2020-05-24 MED ORDER — PHENOL 1.4 % MT LIQD
1.0000 | OROMUCOSAL | Status: DC | PRN
  Filled 2020-05-24: qty 177

## 2020-05-24 MED ORDER — METOCLOPRAMIDE HCL 5 MG/ML IJ SOLN
5.0000 mg | Freq: Three times a day (TID) | INTRAMUSCULAR | Status: DC | PRN
  Filled 2020-05-24: qty 2

## 2020-05-24 MED ORDER — BISACODYL 10 MG RE SUPP
10.0000 mg | Freq: Every day | RECTAL | Status: DC | PRN
  Filled 2020-05-24: qty 1

## 2020-05-24 MED ORDER — ATORVASTATIN CALCIUM 20 MG PO TABS
40.0000 mg | ORAL_TABLET | Freq: Every morning | ORAL | Status: DC
  Administered 2020-05-25 – 2020-05-29 (×5): 40 mg via ORAL
  Filled 2020-05-24 (×5): qty 2

## 2020-05-24 MED ORDER — ADULT MULTIVITAMIN W/MINERALS CH
1.0000 | ORAL_TABLET | Freq: Every day | ORAL | Status: DC
  Administered 2020-05-25 – 2020-05-29 (×5): 1 via ORAL
  Filled 2020-05-24 (×6): qty 1

## 2020-05-24 MED ORDER — CEFAZOLIN SODIUM-DEXTROSE 2-4 GM/100ML-% IV SOLN
INTRAVENOUS | Status: AC
  Filled 2020-05-24: qty 100

## 2020-05-24 MED ORDER — FENTANYL CITRATE (PF) 100 MCG/2ML IJ SOLN
INTRAMUSCULAR | Status: DC | PRN
  Administered 2020-05-24: 50 ug via INTRAVENOUS

## 2020-05-24 MED ORDER — TRAMADOL HCL 50 MG PO TABS
50.0000 mg | ORAL_TABLET | Freq: Four times a day (QID) | ORAL | Status: DC
  Administered 2020-05-25 – 2020-05-26 (×3): 50 mg via ORAL
  Filled 2020-05-24 (×2): qty 1

## 2020-05-24 MED ORDER — FENTANYL CITRATE (PF) 100 MCG/2ML IJ SOLN: 25.0000 ug | INTRAMUSCULAR | Status: DC | PRN

## 2020-05-24 MED ORDER — MIDAZOLAM HCL 2 MG/2ML IJ SOLN
INTRAMUSCULAR | Status: AC
  Filled 2020-05-24: qty 2

## 2020-05-24 MED ORDER — ACETAMINOPHEN 500 MG PO TABS
ORAL_TABLET | ORAL | Status: AC
  Administered 2020-05-24: 1000 mg via ORAL
  Filled 2020-05-24: qty 2

## 2020-05-24 MED ORDER — CEFAZOLIN SODIUM-DEXTROSE 2-4 GM/100ML-% IV SOLN
2.0000 g | Freq: Four times a day (QID) | INTRAVENOUS | Status: AC
  Administered 2020-05-24: 2 g via INTRAVENOUS

## 2020-05-24 MED ORDER — MORPHINE SULFATE (PF) 10 MG/ML IV SOLN
INTRAVENOUS | Status: AC
  Filled 2020-05-24: qty 1

## 2020-05-24 MED ORDER — BUPIVACAINE LIPOSOME 1.3 % IJ SUSP
INTRAMUSCULAR | Status: AC
  Filled 2020-05-24: qty 20

## 2020-05-24 MED ORDER — ORAL CARE MOUTH RINSE: 15.0000 mL | Freq: Once | OROMUCOSAL | Status: AC

## 2020-05-24 MED ORDER — EPHEDRINE SULFATE 50 MG/ML IJ SOLN
INTRAMUSCULAR | Status: DC | PRN
  Administered 2020-05-24: 7.5 mg via INTRAVENOUS

## 2020-05-24 MED ORDER — NEOMYCIN-POLYMYXIN B GU 40-200000 IR SOLN
Status: AC
  Filled 2020-05-24: qty 20

## 2020-05-24 MED ORDER — KETOROLAC TROMETHAMINE 30 MG/ML IJ SOLN
INTRAMUSCULAR | Status: DC | PRN
  Administered 2020-05-24: 30 mg via INTRAMUSCULAR

## 2020-05-24 MED ORDER — OXYCODONE HCL 5 MG PO TABS
5.0000 mg | ORAL_TABLET | ORAL | Status: DC | PRN
  Administered 2020-05-24: 5 mg via ORAL
  Filled 2020-05-24 (×2): qty 2

## 2020-05-24 MED ORDER — CHLORHEXIDINE GLUCONATE 0.12 % MT SOLN
OROMUCOSAL | Status: AC
  Administered 2020-05-24: 15 mL via OROMUCOSAL
  Filled 2020-05-24: qty 15

## 2020-05-24 MED ORDER — DIPHENHYDRAMINE HCL 12.5 MG/5ML PO ELIX
12.5000 mg | ORAL_SOLUTION | ORAL | Status: DC | PRN
  Administered 2020-05-29: 25 mg via ORAL
  Filled 2020-05-24 (×2): qty 10

## 2020-05-24 MED ORDER — TRAMADOL HCL 50 MG PO TABS
ORAL_TABLET | ORAL | Status: AC
  Administered 2020-05-24: 50 mg via ORAL
  Filled 2020-05-24: qty 1

## 2020-05-24 MED ORDER — MENTHOL 3 MG MT LOZG
1.0000 | LOZENGE | OROMUCOSAL | Status: DC | PRN
  Filled 2020-05-24: qty 9

## 2020-05-24 MED ORDER — PANTOPRAZOLE SODIUM 40 MG PO TBEC
40.0000 mg | DELAYED_RELEASE_TABLET | Freq: Every day | ORAL | Status: DC
  Administered 2020-05-25 – 2020-05-29 (×5): 40 mg via ORAL
  Filled 2020-05-24 (×6): qty 1

## 2020-05-24 MED ORDER — SODIUM CHLORIDE 0.9 % IV SOLN
INTRAVENOUS | Status: DC | PRN
  Administered 2020-05-24: 60 mL

## 2020-05-24 MED ORDER — ACETAMINOPHEN 500 MG PO TABS
1000.0000 mg | ORAL_TABLET | Freq: Four times a day (QID) | ORAL | Status: AC
  Administered 2020-05-25: 1000 mg via ORAL

## 2020-05-24 MED ORDER — LEVOTHYROXINE SODIUM 50 MCG PO TABS
50.0000 ug | ORAL_TABLET | Freq: Every day | ORAL | Status: DC
  Administered 2020-05-25 – 2020-05-29 (×5): 50 ug via ORAL
  Filled 2020-05-24 (×6): qty 1

## 2020-05-24 MED ORDER — ZOLPIDEM TARTRATE 5 MG PO TABS
5.0000 mg | ORAL_TABLET | Freq: Every evening | ORAL | Status: DC | PRN
  Administered 2020-05-24 – 2020-05-28 (×5): 5 mg via ORAL
  Filled 2020-05-24 (×5): qty 1

## 2020-05-24 MED ORDER — ALUM & MAG HYDROXIDE-SIMETH 200-200-20 MG/5ML PO SUSP: 30.0000 mL | ORAL | Status: DC | PRN

## 2020-05-24 MED ORDER — ONDANSETRON HCL 4 MG/2ML IJ SOLN
INTRAMUSCULAR | Status: DC | PRN
  Administered 2020-05-24: 4 mg via INTRAVENOUS

## 2020-05-24 MED ORDER — NEOMYCIN-POLYMYXIN B GU 40-200000 IR SOLN
Status: DC | PRN
  Administered 2020-05-24: 16 mL

## 2020-05-24 MED ORDER — TRANEXAMIC ACID-NACL 1000-0.7 MG/100ML-% IV SOLN
INTRAVENOUS | Status: AC
  Filled 2020-05-24: qty 100

## 2020-05-24 MED ORDER — HYDROMORPHONE HCL 1 MG/ML IJ SOLN: 0.5000 mg | INTRAMUSCULAR | Status: DC | PRN

## 2020-05-24 MED ORDER — ONDANSETRON HCL 4 MG/2ML IJ SOLN
INTRAMUSCULAR | Status: AC
  Filled 2020-05-24: qty 2

## 2020-05-24 MED ORDER — MIDAZOLAM HCL 5 MG/5ML IJ SOLN
INTRAMUSCULAR | Status: DC | PRN
  Administered 2020-05-24: 2 mg via INTRAVENOUS

## 2020-05-24 MED ORDER — CEFAZOLIN SODIUM-DEXTROSE 2-4 GM/100ML-% IV SOLN
INTRAVENOUS | Status: AC
  Administered 2020-05-24: 2 g via INTRAVENOUS
  Filled 2020-05-24: qty 100

## 2020-05-24 MED ORDER — METOCLOPRAMIDE HCL 10 MG PO TABS
5.0000 mg | ORAL_TABLET | Freq: Three times a day (TID) | ORAL | Status: DC | PRN
  Administered 2020-05-26: 10 mg via ORAL
  Filled 2020-05-24: qty 1

## 2020-05-24 MED ORDER — ONDANSETRON HCL 4 MG/2ML IJ SOLN: 4.0000 mg | Freq: Once | INTRAMUSCULAR | Status: DC | PRN

## 2020-05-24 MED ORDER — EPHEDRINE 5 MG/ML INJ
INTRAVENOUS | Status: AC
  Filled 2020-05-24: qty 10

## 2020-05-24 MED ORDER — FENTANYL CITRATE (PF) 100 MCG/2ML IJ SOLN
INTRAMUSCULAR | Status: AC
  Filled 2020-05-24: qty 2

## 2020-05-24 MED ORDER — OXYCODONE HCL 5 MG PO TABS
ORAL_TABLET | ORAL | Status: AC
  Administered 2020-05-24: 10 mg via ORAL
  Filled 2020-05-24: qty 2

## 2020-05-24 MED ORDER — TRAMADOL HCL 50 MG PO TABS
ORAL_TABLET | ORAL | Status: AC
  Filled 2020-05-24: qty 1

## 2020-05-24 MED ORDER — METHOCARBAMOL 500 MG PO TABS
500.0000 mg | ORAL_TABLET | Freq: Four times a day (QID) | ORAL | Status: DC | PRN
  Administered 2020-05-25 – 2020-05-29 (×6): 500 mg via ORAL
  Filled 2020-05-24 (×6): qty 1

## 2020-05-24 MED ORDER — AMITRIPTYLINE HCL 25 MG PO TABS
50.0000 mg | ORAL_TABLET | Freq: Every day | ORAL | Status: DC
  Administered 2020-05-24 – 2020-05-28 (×5): 50 mg via ORAL
  Filled 2020-05-24 (×2): qty 2
  Filled 2020-05-24 (×2): qty 1
  Filled 2020-05-24 (×2): qty 2

## 2020-05-24 MED ORDER — ONDANSETRON HCL 4 MG/2ML IJ SOLN
4.0000 mg | Freq: Four times a day (QID) | INTRAMUSCULAR | Status: DC | PRN
  Administered 2020-05-24 – 2020-05-26 (×2): 4 mg via INTRAVENOUS
  Filled 2020-05-24: qty 2

## 2020-05-24 MED ORDER — DEXMEDETOMIDINE HCL IN NACL 80 MCG/20ML IV SOLN
INTRAVENOUS | Status: AC
  Filled 2020-05-24: qty 20

## 2020-05-24 MED ORDER — TRANEXAMIC ACID-NACL 1000-0.7 MG/100ML-% IV SOLN
1000.0000 mg | Freq: Once | INTRAVENOUS | Status: AC
  Administered 2020-05-24: 1000 mg via INTRAVENOUS
  Filled 2020-05-24: qty 100

## 2020-05-24 MED ORDER — PHENYLEPHRINE HCL (PRESSORS) 10 MG/ML IV SOLN
INTRAVENOUS | Status: DC | PRN
  Administered 2020-05-24: 50 ug via INTRAVENOUS

## 2020-05-24 MED ORDER — METHOCARBAMOL 1000 MG/10ML IJ SOLN
500.0000 mg | Freq: Four times a day (QID) | INTRAVENOUS | Status: DC | PRN
  Filled 2020-05-24: qty 5

## 2020-05-24 MED ORDER — ONDANSETRON HCL 4 MG PO TABS
4.0000 mg | ORAL_TABLET | Freq: Four times a day (QID) | ORAL | Status: DC | PRN
  Administered 2020-05-27: 4 mg via ORAL
  Filled 2020-05-24: qty 1

## 2020-05-24 MED ORDER — FAMOTIDINE 20 MG PO TABS: 20.0000 mg | ORAL_TABLET | Freq: Once | ORAL | Status: AC

## 2020-05-24 MED ORDER — PROPOFOL 500 MG/50ML IV EMUL
INTRAVENOUS | Status: DC | PRN
  Administered 2020-05-24: 50 ug/kg/min via INTRAVENOUS

## 2020-05-24 MED ORDER — VITAMIN D 25 MCG (1000 UNIT) PO TABS
1000.0000 [IU] | ORAL_TABLET | Freq: Every day | ORAL | Status: DC
  Administered 2020-05-25 – 2020-05-29 (×5): 1000 [IU] via ORAL
  Filled 2020-05-24: qty 3
  Filled 2020-05-24 (×2): qty 1
  Filled 2020-05-24: qty 3
  Filled 2020-05-24 (×2): qty 1

## 2020-05-24 MED ORDER — FAMOTIDINE 20 MG PO TABS
ORAL_TABLET | ORAL | Status: AC
  Administered 2020-05-24: 20 mg via ORAL
  Filled 2020-05-24: qty 1

## 2020-05-24 MED ORDER — BUPIVACAINE-EPINEPHRINE (PF) 0.25% -1:200000 IJ SOLN
INTRAMUSCULAR | Status: DC | PRN
  Administered 2020-05-24: 30 mL

## 2020-05-24 MED ORDER — SODIUM CHLORIDE 0.9 % IV SOLN: INTRAVENOUS | Status: DC

## 2020-05-24 MED ORDER — EPINEPHRINE PF 1 MG/ML IJ SOLN
INTRAMUSCULAR | Status: AC
  Filled 2020-05-24: qty 1

## 2020-05-24 MED ORDER — OXYCODONE HCL 5 MG PO TABS
10.0000 mg | ORAL_TABLET | ORAL | Status: DC | PRN
  Filled 2020-05-24: qty 3

## 2020-05-24 MED ORDER — BUPIVACAINE HCL (PF) 0.5 % IJ SOLN
INTRAMUSCULAR | Status: AC
  Filled 2020-05-24: qty 10

## 2020-05-24 MED ORDER — PHENYLEPHRINE HCL (PRESSORS) 10 MG/ML IV SOLN
INTRAVENOUS | Status: AC
  Filled 2020-05-24: qty 1

## 2020-05-24 MED ORDER — BUPIVACAINE HCL (PF) 0.25 % IJ SOLN
INTRAMUSCULAR | Status: AC
  Filled 2020-05-24: qty 30

## 2020-05-24 MED ORDER — CHLORHEXIDINE GLUCONATE 0.12 % MT SOLN: 15.0000 mL | Freq: Once | OROMUCOSAL | Status: AC

## 2020-05-24 MED ORDER — DEXMEDETOMIDINE HCL 200 MCG/2ML IV SOLN
INTRAVENOUS | Status: DC | PRN
  Administered 2020-05-24: 4 ug via INTRAVENOUS

## 2020-05-24 MED ORDER — METHOCARBAMOL 500 MG PO TABS
ORAL_TABLET | ORAL | Status: AC
  Filled 2020-05-24: qty 1

## 2020-05-24 SURGICAL SUPPLY — 72 items
BLADE SAGITTAL 25.0X1.19X90 (BLADE) ×2 IMPLANT
BLOCK CUTTING FEMUR 2+ LT MED (MISCELLANEOUS) ×2 IMPLANT
BLOCK CUTTING TIBIAL 2 LT (MISCELLANEOUS) ×2 IMPLANT
BNDG ELASTIC 6X5.8 VLCR STR LF (GAUZE/BANDAGES/DRESSINGS) ×2 IMPLANT
CANISTER SUCT 1200ML W/VALVE (MISCELLANEOUS) ×2 IMPLANT
CANISTER SUCT 3000ML PPV (MISCELLANEOUS) ×4 IMPLANT
CANISTER WOUND CARE 500ML ATS (WOUND CARE) ×2 IMPLANT
CEMENT HV SMART SET (Cement) ×4 IMPLANT
CHLORAPREP W/TINT 26 (MISCELLANEOUS) ×4 IMPLANT
COOLER POLAR GLACIER W/PUMP (MISCELLANEOUS) ×2 IMPLANT
COVER WAND RF STERILE (DRAPES) ×2 IMPLANT
CUFF TOURN SGL QUICK 24 (TOURNIQUET CUFF)
CUFF TOURN SGL QUICK 30 (TOURNIQUET CUFF)
CUFF TOURN SGL QUICK 34 (TOURNIQUET CUFF) ×2
CUFF TRNQT CYL 24X4X16.5-23 (TOURNIQUET CUFF) IMPLANT
CUFF TRNQT CYL 30X4X21-28X (TOURNIQUET CUFF) IMPLANT
CUFF TRNQT CYL 34X4X40X1 (TOURNIQUET CUFF) ×1 IMPLANT
DRAPE 3/4 80X56 (DRAPES) ×4 IMPLANT
ELECT CAUTERY BLADE 6.4 (BLADE) ×2 IMPLANT
ELECT REM PT RETURN 9FT ADLT (ELECTROSURGICAL) ×2
ELECTRODE REM PT RTRN 9FT ADLT (ELECTROSURGICAL) ×1 IMPLANT
FEMERAL COMPONENT LEFT SZ2P (Femur) ×2 IMPLANT
FEMUR BONE MODEL 4.9010 MEDACT (MISCELLANEOUS) ×2 IMPLANT
GAUZE SPONGE 4X4 12PLY STRL (GAUZE/BANDAGES/DRESSINGS) ×2 IMPLANT
GAUZE XEROFORM 1X8 LF (GAUZE/BANDAGES/DRESSINGS) ×2 IMPLANT
GLOVE BIOGEL PI IND STRL 9 (GLOVE) ×1 IMPLANT
GLOVE BIOGEL PI INDICATOR 9 (GLOVE) ×1
GLOVE INDICATOR 8.0 STRL GRN (GLOVE) ×2 IMPLANT
GLOVE SURG ORTHO 8.0 STRL STRW (GLOVE) ×2 IMPLANT
GLOVE SURG SYN 9.0  PF PI (GLOVE) ×2
GLOVE SURG SYN 9.0 PF PI (GLOVE) ×1 IMPLANT
GOWN SRG 2XL LVL 4 RGLN SLV (GOWNS) ×1 IMPLANT
GOWN STRL NON-REIN 2XL LVL4 (GOWNS) ×2
GOWN STRL REUS W/ TWL LRG LVL3 (GOWN DISPOSABLE) ×1 IMPLANT
GOWN STRL REUS W/ TWL XL LVL3 (GOWN DISPOSABLE) ×1 IMPLANT
GOWN STRL REUS W/TWL LRG LVL3 (GOWN DISPOSABLE) ×2
GOWN STRL REUS W/TWL XL LVL3 (GOWN DISPOSABLE) ×2
HOLDER FOLEY CATH W/STRAP (MISCELLANEOUS) ×2 IMPLANT
HOOD PEEL AWAY FLYTE STAYCOOL (MISCELLANEOUS) ×4 IMPLANT
INSERT TIBIAL FIXED SZ2 LT (Insert) ×2 IMPLANT
KIT PREVENA INCISION MGT20CM45 (CANNISTER) ×2 IMPLANT
KIT TURNOVER KIT A (KITS) ×2 IMPLANT
NDL SAFETY ECLIPSE 18X1.5 (NEEDLE) ×1 IMPLANT
NEEDLE HYPO 18GX1.5 SHARP (NEEDLE) ×2
NEEDLE SPNL 18GX3.5 QUINCKE PK (NEEDLE) ×2 IMPLANT
NEEDLE SPNL 20GX3.5 QUINCKE YW (NEEDLE) ×2 IMPLANT
NS IRRIG 1000ML POUR BTL (IV SOLUTION) ×2 IMPLANT
PACK TOTAL KNEE (MISCELLANEOUS) ×2 IMPLANT
PAD WRAPON POLAR KNEE (MISCELLANEOUS) ×1 IMPLANT
PATELLA RESURFACING MEDACTA 02 (Bone Implant) ×2 IMPLANT
PENCIL SMOKE EVACUATOR COATED (MISCELLANEOUS) ×2 IMPLANT
PULSAVAC PLUS IRRIG FAN TIP (DISPOSABLE) ×2
SCALPEL PROTECTED #10 DISP (BLADE) ×4 IMPLANT
SOL .9 NS 3000ML IRR  AL (IV SOLUTION) ×2
SOL .9 NS 3000ML IRR UROMATIC (IV SOLUTION) ×1 IMPLANT
STAPLER SKIN PROX 35W (STAPLE) ×2 IMPLANT
STEM EXTENSION 11MMX30MM (Stem) ×2 IMPLANT
SUCTION FRAZIER HANDLE 10FR (MISCELLANEOUS) ×2
SUCTION TUBE FRAZIER 10FR DISP (MISCELLANEOUS) ×1 IMPLANT
SUT DVC 2 QUILL PDO  T11 36X36 (SUTURE) ×2
SUT DVC 2 QUILL PDO T11 36X36 (SUTURE) ×1 IMPLANT
SUT ETHIBOND 2 V 37 (SUTURE) ×2 IMPLANT
SUT V-LOC 90 ABS DVC 3-0 CL (SUTURE) ×2 IMPLANT
SYR 20ML LL LF (SYRINGE) ×2 IMPLANT
SYR 50ML LL SCALE MARK (SYRINGE) ×4 IMPLANT
TIBIAL BONE MODEL LEFT (MISCELLANEOUS) ×2 IMPLANT
TIBIAL TRAY FIXED MEDACTA 0207 (Joint) ×2 IMPLANT
TIP FAN IRRIG PULSAVAC PLUS (DISPOSABLE) ×1 IMPLANT
TOWEL OR 17X26 4PK STRL BLUE (TOWEL DISPOSABLE) ×2 IMPLANT
TOWER CARTRIDGE SMART MIX (DISPOSABLE) ×2 IMPLANT
TRAY FOLEY MTR SLVR 16FR STAT (SET/KITS/TRAYS/PACK) ×2 IMPLANT
WRAPON POLAR PAD KNEE (MISCELLANEOUS) ×2

## 2020-05-24 NOTE — Evaluation (Signed)
Physical Therapy Evaluation Patient Details Name: Stacey Henson MRN: [id number] DOB: [birth year] Today's Date: 05/24/2020   History of Present Illness  Pt is a 64 yo female s/p L TKA, WBAT. PMH of PMH of headaches, hypothyroidism, interstitial cystitis, bladder surgery  Clinical Impression  Pt alert, agreeable to PT, premedicated prior to PT session reported throbbing pain. Stated at baseline she is independent, lives alone, pretty confident that she could arrange with family 24/7 supervision if needed.  The patient was able to perform therapeutic exercises with verbal and tactile cues, physical assist for heel slides and SLR. Supine to sit with very little minA for LLE, fair sitting balance noted. Sit <> Stand with RW and CGA from elevated surface, cueing for hand placement to increase safety. The patient was able to take several steps to the recliner with RW and CGA, no unsteadiness noted. Up in chair with all needs in reach, family at bedside.  Overall the patient demonstrated deficits (see "PT Problem List") that impede the patient's functional abilities, safety, and mobility and would benefit from skilled PT intervention. Recommendation is HHPT pending progress with mobility with 24/7 supervision/assistance.     Follow Up Recommendations Home health PT;Supervision/Assistance - 24 hour    Equipment Recommendations  Rolling walker with 5" wheels;Other (comment)(youth sized RW)    Recommendations for Other Services OT consult     Precautions / Restrictions Precautions Precautions: Knee Precaution Booklet Issued: No Restrictions Weight Bearing Restrictions: Yes LLE Weight Bearing: Weight bearing as tolerated      Mobility  Bed Mobility Overal bed mobility: Needs Assistance Bed Mobility: Supine to Sit     Supine to sit: HOB elevated;Min assist     General bed mobility comments: very light minA for LLE  Transfers Overall transfer level: Needs assistance Equipment  used: Rolling walker (2 wheeled) Transfers: Sit to/from Stand Sit to Stand: Min guard;From elevated surface            Ambulation/Gait Ambulation/Gait assistance: Min guard Gait Distance (Feet): 2 Feet Assistive device: Rolling walker (2 wheeled)       General Gait Details: step to gait pattern; may need a youth walker  Stairs            Wheelchair Mobility    Modified Rankin (Stroke Patients Only)       Balance Overall balance assessment: Needs assistance Sitting-balance support: Feet supported Sitting balance-Leahy Scale: Good       Standing balance-Leahy Scale: Fair                               Pertinent Vitals/Pain Pain Assessment: Faces Faces Pain Scale: Hurts little more Pain Location: with exercises Pain Descriptors / Indicators: Throbbing Pain Intervention(s): Limited activity within patient's tolerance;Monitored during session;Repositioned;Ice applied;Premedicated before session    [facility] expects to be discharged to:: Private residence Living Arrangements: Alone Available Help at Discharge: Available 24 hours/day Type of Home: House Home Access: Stairs to enter Entrance Stairs-Rails: Psychiatric nurse of Steps: 5+3 Home Layout: One level Home Equipment: Environmental consultant - 4 wheels;Bedside commode;Grab bars - toilet;Grab bars - tub/shower      Prior Function Level of Independence: Independent               Hand Dominance        Extremity/Trunk Assessment   Upper Extremity Assessment Upper Extremity Assessment: Overall WFL for tasks assessed    Lower Extremity Assessment Lower Extremity Assessment:  RLE deficits/detail;LLE deficits/detail RLE Deficits / Details: WFLs LLE Deficits / Details: s/p L TKA       Communication   Communication: No difficulties  Cognition Arousal/Alertness: Awake/alert Behavior During Therapy: WFL for tasks assessed/performed Overall Cognitive Status:  Within Functional Limits for tasks assessed                                        General Comments      Exercises Total Joint Exercises Ankle Circles/Pumps: AROM;Strengthening;Both;15 reps Quad Sets: AROM;Strengthening;Left;15 reps Heel Slides: AAROM;Strengthening;Left;10 reps Straight Leg Raises: AAROM;Strengthening;Left;10 reps Goniometric ROM: -3 to grossly ~60deg   Assessment/Plan    PT Assessment Patient needs continued PT services  PT Problem List Decreased strength;Decreased mobility;Decreased range of motion;Decreased activity tolerance;Decreased balance;Pain;Decreased knowledge of use of DME;Decreased knowledge of precautions       PT Treatment Interventions DME instruction;Therapeutic exercise;Gait training;Balance training;Stair training;Functional mobility training;Therapeutic activities;Patient/family education;Neuromuscular re-education    PT Goals (Current goals can be found in the Care Plan section)  Acute Rehab PT Goals Patient Stated Goal: to go home PT Goal Formulation: With patient Time For Goal Achievement: 06/07/20 Potential to Achieve Goals: Good    Frequency BID   Barriers to discharge        Co-evaluation               AM-PAC PT "6 Clicks" Mobility  Outcome Measure Help needed turning from your back to your side while in a flat bed without using bedrails?: A Little Help needed moving from lying on your back to sitting on the side of a flat bed without using bedrails?: A Little Help needed moving to and from a bed to a chair (including a wheelchair)?: A Little Help needed standing up from a chair using your arms (e.g., wheelchair or bedside chair)?: A Little Help needed to walk in hospital room?: A Little Help needed climbing 3-5 steps with a railing? : A Little 6 Click Score: 18    End of Session Equipment Utilized During Treatment: Gait belt Activity Tolerance: Patient tolerated treatment well Patient left: in  chair;with call bell/phone within reach;with family/visitor present Nurse Communication: Mobility status PT Visit Diagnosis: Difficulty in walking, not elsewhere classified (R26.2);Muscle weakness (generalized) (M62.81);Pain;Other abnormalities of gait and mobility (R26.89) Pain - Right/Left: Left Pain - part of body: Knee    Time: 2671-2458 PT Time Calculation (min) (ACUTE ONLY): 34 min   Charges:   PT Evaluation $PT Eval Low Complexity: 1 Low PT Treatments $Therapeutic Exercise: 23-37 mins       Olga Coaster PT, DPT 3:16 PM,05/24/20

## 2020-05-24 NOTE — Anesthesia Procedure Notes (Signed)
Spinal  Patient location during procedure: OR Start time: 05/24/2020 7:20 AM End time: 05/24/2020 7:41 AM Staffing Performed: anesthesiologist and resident/CRNA  Anesthesiologist: Tera Mater, MD Resident/CRNA: Caryl Asp, CRNA Preanesthetic Checklist Completed: patient identified, IV checked, site marked, risks and benefits discussed, surgical consent, monitors and equipment checked, pre-op evaluation and timeout performed Spinal Block Patient position: sitting Prep: Betadine Patient monitoring: heart rate, continuous pulse ox, blood pressure and cardiac monitor Approach: midline Location: L4-5 Injection technique: single-shot Needle Needle type: Whitacre and Introducer  Needle gauge: 24 G Needle length: 9 cm Additional Notes Negative paresthesia. Negative blood return. Positive free-flowing CSF. Expiration date of kit checked and confirmed. Patient tolerated procedure well, without complications.

## 2020-05-24 NOTE — Anesthesia Preprocedure Evaluation (Addendum)
Anesthesia Evaluation  Patient identified by MRN, date of birth, ID band Patient awake    Reviewed: Allergy & Precautions, H&P , NPO status , Patient's Chart, lab work & pertinent test results  History of Anesthesia Complications (+) PONV and history of anesthetic complications  Airway Mallampati: II  TM Distance: >3 FB     Dental  (+) Teeth Intact   Pulmonary neg pulmonary ROS, neg COPD,    breath sounds clear to auscultation       Cardiovascular (-) angina(-) Past MI negative cardio ROS  (-) dysrhythmias  Rhythm:regular Rate:Normal     Neuro/Psych  Headaches, negative psych ROS   GI/Hepatic negative GI ROS, Neg liver ROS,   Endo/Other  Hypothyroidism   Renal/GU      Musculoskeletal   Abdominal   Peds  Hematology negative hematology ROS (+)   Anesthesia Other Findings Past Medical History: No date: Arthritis No date: Bladder spasms No date: Complication of anesthesia No date: Headache     Comment:  H/O MIGRAINES No date: Hypothyroidism No date: IC (interstitial cystitis) No date: Lipidemia No date: PONV (postoperative nausea and vomiting)     Comment:  X 2 No date: Vertigo     Comment:  DUE TO ALLERGIES  Past Surgical History: No date: ABDOMINAL HYSTERECTOMY No date: APPENDECTOMY No date: BLADDER SURGERY     Comment:  X2 No date: INTERSTIM IMPLANT PLACEMENT     Comment:  BLADDER-DOES NOT WORK DUE TO BATTERY No date: RECTOVAGINAL FISTULA CLOSURE  BMI    Body Mass Index: 44.00 kg/m      Reproductive/Obstetrics negative OB ROS                            Anesthesia Physical Anesthesia Plan  ASA: II  Anesthesia Plan: Spinal   Post-op Pain Management:    Induction:   PONV Risk Score and Plan: Propofol infusion  Airway Management Planned: Natural Airway and Simple Face Mask  Additional Equipment:   Intra-op Plan:   Post-operative Plan:   Informed Consent: I  have reviewed the patients History and Physical, chart, labs and discussed the procedure including the risks, benefits and alternatives for the proposed anesthesia with the patient or authorized representative who has indicated his/her understanding and acceptance.     Dental Advisory Given  Plan Discussed with: Anesthesiologist, CRNA and Surgeon  Anesthesia Plan Comments:         Anesthesia Quick Evaluation

## 2020-05-24 NOTE — Op Note (Signed)
Date May 24, 2020  Time 9:45 AM   PATIENT:   Stacey Henson   PRE-OPERATIVE DIAGNOSIS:  Primary localized osteoarthritis of left knee knee   POST-OPERATIVE DIAGNOSIS:  Same   PROCEDURE:  Procedure(s): Left TOTAL KNEE ARTHROPLASTY   SURGEON: Leitha Schuller, MD   ASSISTANTS: Cranston Neighbor, PA-C   ANESTHESIA:   spinal   EBL:     BLOOD ADMINISTERED:none   DRAINS: Incisional wound VAC    LOCAL MEDICATIONS USED:  MARCAINE    and OTHER Exparel and Toradol   SPECIMEN:  No Specimen   DISPOSITION OF SPECIMEN:  N/A   COUNTS:  YES   TOURNIQUET:   63 minutes at 300 mm Hg   IMPLANTS: Medacta  GMK sphere system with  left 2+ femur, left 2 tibia with short stem and  13 mm insert.  Size  2 patella, all components cemented.   DICTATION: Reubin Milan Dictation   patient was brought to the operating room and spinal anesthesia was obtained.  After prepping and draping the  left leg in sterile fashion, and after patient identification and timeout procedures were completed,  the leg was exsanguinated with Esmarch and tourniquet was raised  and midline skin incision was made followed by medial parapatellar arthrotomy with  severe medial compartment osteoarthritis, severe patellofemoral arthritis and  mild lateral compartment arthritis, partial synovectomy was also carried out.   The ACL and PCL and fat pad were excised along with anterior horns of the meniscus. The proximal tibia cutting guide from  the [facility] system was applied and the proximal tibia cut carried out.  The distal femoral cut was carried out in a similar fashion     The  2+ femoral cutting guide applied with anterior posterior and chamfer cuts made.  The posterior horns of the menisci were removed at this point.   Injection of the above medication was carried out after the femoral and tibial cuts were carried out.  The  2 baseplate trial was placed pinned into position and proximal tibial preparation carried out with drilling hand reaming  and the keel punch followed by placement of the  2+ femur and sizing the tibial insert, size   13 millimeter gave the best fit with stability and full extension.  The distal femoral drill holes were made in the notch cut for the trochlear groove was then carried out with trials were then removed the patella was cut using the patellar cutting guide and it sized to a size  2 after drill holes have been made  The knee was irrigated with pulsatile lavage and the bony surfaces dried the tibial component was cemented into place first.  Excess cement was removed and the polyethylene insert placed with a torque screw placed with a torque screwdriver tightened.  The distal femoral component was placed and the knee was held in extension as the patellar button was clamped into place.  After the cement was set, excess cement was removed and the knee was again irrigated thoroughly thoroughly irrigated.  The tourniquet was let down and hemostasis checked with electrocautery.   There was a tight lateral retinaculum and a partial lateral release was carried out from the outside not going to the synovium.  The arthrotomy was repaired with a #2 Ethibond followed by  a heavy Quill suture,  followed by 3-0 V lock subcuticular closure, skin staples followed by incisional wound VAC and Polar Care.Marland Kitchen   PLAN OF CARE: Admit for overnight observation   PATIENT DISPOSITION:  PACU - hemodynamically stable.

## 2020-05-24 NOTE — Progress Notes (Signed)
Physical therapy in with patient.

## 2020-05-24 NOTE — H&P (Signed)
Chief Complaint  Patient presents with  . Follow-up  Lt Knee OA   History of the Present Illness: Stacey Henson is a 64 y.o. female here for left total knee H and P prior to surgery scheduled for 05/24/2020. She had a left knee MyKnee CT obtained at the end of 02/2020. At her last visit, it was discussed that she has difficulty walking more than 50 feet secondary to her left knee pain. She has had corticosteroid injections without relief. Both knees are painful, but left is worse than right. She has had increasing difficulty with her left knee with instability, locking, giving way, crepitation with range of motion and weightbearing every step, and swelling to the knee. She occasionally has hip pain. She previously had activities away from work of walking to the park with her grandchildren, but is no longer able to do that.  The patient also has interstitial cystitis.   The patient is employed and works from home.   I have reviewed past medical, surgical, social and family history, and allergies as documented in the EMR.  Past Medical History: Past Medical History:  Diagnosis Date  . Hyperlipidemia  . Hypertension  . 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 Lorrin Mais "for years"  . Interstitial cystitis  has a metronic device that has expired  . Migraine   Past Surgical History: Past Surgical History:  Procedure Laterality Date  . APPENDECTOMY  . HYSTERECTOMY  . TUBAL LIGATION   Past Family History: Family History  Problem Relation Age of Onset  . Hyperlipidemia (Elevated cholesterol) Mother  . Obesity Mother  . Hyperlipidemia (Elevated cholesterol) Father  . Colon polyps Brother  . Depression Paternal Uncle   Medications: Current Outpatient Medications Ordered in Epic  Medication Sig Dispense Refill  . amitriptyline (ELAVIL) 50 MG tablet Take 1 tablet by mouth nightly  . atorvastatin (LIPITOR) 40 MG tablet Take by mouth.  . cyclobenzaprine (FLEXERIL) 5 MG tablet Take  1 tablet by mouth 3 (three) times daily  . levothyroxine (SYNTHROID, LEVOTHROID) 50 MCG tablet Take by mouth.  . trospium (SANCTURA) 20 mg tablet Take 1 tablet by mouth 2 (two) times daily  . zolpidem (AMBIEN) 10 mg tablet Take by mouth.   No current Epic-ordered facility-administered medications on file.   Allergies: No Known Allergies   Body mass index is 43.26 kg/m.  Review of Systems: A comprehensive 14 point ROS was performed, reviewed, and the pertinent orthopaedic findings are documented in the HPI.  Vitals:  05/07/20 0827  BP: 138/84   General Physical Examination:  General/Constitutional: No apparent distress: well-nourished and well developed. Eyes: Pupils equal, round with synchronous movement. Lungs: Clear to auscultation HEENT: Normal Vascular: No edema, swelling or tenderness, except as noted in detailed exam. Cardiac: Heart rate and rhythm is regular. Integumentary: No impressive skin lesions present, except as noted in detailed exam. Neuro/Psych: Normal mood and affect, oriented to person, place and time.  Musculoskeletal Examination: On exam, left knee range of motion is 0-110 degrees. Previously noted to have internal rotation of the left hip of 30 degrees, external 70 degrees. Patellofemoral and medial crepitation. Left knee ecchymosis. Varus deformity that is correctable. Left knee mild Baker cyst. Sensation intact to left foot. Lungs are clear. Heart rate and rhythm is normal. HEENT is normal. Teeth: Normal, nothing removable. Strong pulses to left leg.   Radiographs: Standing x-rays on 08/26/2019 showed complete loss of medial joint space, lateral subluxation of the tibia, subchondral sclerosis and osteophytes.   Assessment: ICD-10-CM  1. Primary osteoarthritis of left knee M17.12  2. BMI 40.0-44.9, adult (CMS-HCC) Z68.41   Plan: The patient has clinical findings of left knee pain.  We discussed the patient's x-ray findings. We reviewed left knee  arthroplasty and the postoperative course.   Surgical Risks:  The nature of the condition and the proposed procedure has been reviewed in detail with the patient. Surgical versus non-surgical options and prognosis for recovery have been reviewed and the inherent risks and benefits of each have been discussed including the risks of infection, bleeding, injury to nerves/blood vessels/tendons, incomplete relief of symptoms, persisting pain and/or stiffness, loss of function, complex regional pain syndrome, failure of the procedure, as appropriate.   Teeth: Normal, nothing removable.   Scribe Attestation: I, Dawn Royse, am acting as scribe for [employer], MD    Electronically signed by Marlena Clipper, MD at 05/07/2020 6:36 PM EDT   Reviewed paper H+P, will be scanned into chart. No changes noted.

## 2020-05-24 NOTE — Transfer of Care (Signed)
Immediate Anesthesia Transfer of Care Note  Patient: Franki Alcaide  Procedure(s) Performed: LEFT TOTAL KNEE ARTHROPLASTY (Left Knee)  Patient Location: PACU  Anesthesia Type:Spinal  Level of Consciousness: awake, alert  and oriented  Airway & Oxygen Therapy: Patient Spontanous Breathing and Patient connected to nasal cannula oxygen  Post-op Assessment: Report given to RN and Post -op Vital signs reviewed and stable  Post vital signs: Reviewed and stable  Last Vitals:  Vitals Value Taken Time  BP 114/70 05/24/20 0959  Temp 36 C 05/24/20 0942  Pulse 85 05/24/20 0959  Resp 14 05/24/20 0959  SpO2 96 % 05/24/20 0959  Vitals shown include unvalidated device data.  Last Pain:  Vitals:   05/24/20 0942  TempSrc:   PainSc: 0-No pain      Patients Stated Pain Goal: 1 (05/24/20 5259)  Complications: No apparent anesthesia complications

## 2020-05-24 NOTE — Anesthesia Postprocedure Evaluation (Signed)
Anesthesia Post Note  Patient: Stacey Henson  Procedure(s) Performed: LEFT TOTAL KNEE ARTHROPLASTY (Left Knee)  Patient location during evaluation: PACU Anesthesia Type: Spinal Level of consciousness: awake and alert Pain management: pain level controlled Vital Signs Assessment: post-procedure vital signs reviewed and stable Respiratory status: spontaneous breathing, nonlabored ventilation and respiratory function stable Cardiovascular status: blood pressure returned to baseline and stable Postop Assessment: no apparent nausea or vomiting Anesthetic complications: no     Last Vitals:  Vitals:   05/24/20 1112 05/24/20 1115  BP:    Pulse: 94   Resp: (!) 22   Temp:  (!) 36.1 C  SpO2: 100%     Last Pain:  Vitals:   05/24/20 1115  TempSrc:   PainSc: 0-No pain                 Karleen Hampshire

## 2020-05-25 DIAGNOSIS — M1712 Unilateral primary osteoarthritis, left knee: Secondary | ICD-10-CM | POA: Diagnosis not present

## 2020-05-25 LAB — BASIC METABOLIC PANEL
Anion gap: 8 (ref 5–15)
BUN: 16 mg/dL (ref 8–23)
CO2: 27 mmol/L (ref 22–32)
Calcium: 8.9 mg/dL (ref 8.9–10.3)
Chloride: 106 mmol/L (ref 98–111)
Creatinine, Ser: 0.58 mg/dL (ref 0.44–1.00)
GFR calc Af Amer: >60 mL/min (ref >60)
GFR calc non Af Amer: >60 mL/min (ref >60)
Glucose, Bld: 128 mg/dL — ABNORMAL HIGH (ref 70–99)
Potassium: 4.5 mmol/L (ref 3.5–5.1)
Sodium: 141 mmol/L (ref 135–145)

## 2020-05-25 LAB — CBC
HCT: 34.1 % — ABNORMAL LOW (ref 36.0–46.0)
Hemoglobin: 11.3 g/dL — ABNORMAL LOW (ref 12.0–15.0)
MCH: 28.2 pg (ref 26.0–34.0)
MCHC: 33.1 g/dL (ref 30.0–36.0)
MCV: 85 fL (ref 80.0–100.0)
Platelets: 220 10*3/uL (ref 150–400)
RBC: 4.01 MIL/uL (ref 3.87–5.11)
RDW: 13.9 % (ref 11.5–15.5)
WBC: 12.4 10*3/uL — ABNORMAL HIGH (ref 4.0–10.5)
nRBC: 0 % (ref 0.0–0.2)

## 2020-05-25 MED ORDER — TRAMADOL HCL 50 MG PO TABS
ORAL_TABLET | ORAL | Status: AC
  Administered 2020-05-25: 50 mg via ORAL
  Filled 2020-05-25: qty 1

## 2020-05-25 MED ORDER — MAGNESIUM HYDROXIDE 400 MG/5ML PO SUSP
30.0000 mL | Freq: Once | ORAL | Status: AC
  Administered 2020-05-25: 30 mL via ORAL
  Filled 2020-05-25: qty 30

## 2020-05-25 MED ORDER — TRAMADOL HCL 50 MG PO TABS
ORAL_TABLET | ORAL | Status: AC
  Filled 2020-05-25: qty 1

## 2020-05-25 MED ORDER — ACETAMINOPHEN 500 MG PO TABS
ORAL_TABLET | ORAL | Status: AC
  Filled 2020-05-25: qty 2

## 2020-05-25 MED ORDER — ACETAMINOPHEN 325 MG PO TABS
ORAL_TABLET | ORAL | Status: AC
  Administered 2020-05-25: 650 mg via ORAL
  Filled 2020-05-25: qty 2

## 2020-05-25 MED ORDER — METHOCARBAMOL 500 MG PO TABS
ORAL_TABLET | ORAL | Status: AC
  Administered 2020-05-25: 500 mg via ORAL
  Filled 2020-05-25: qty 1

## 2020-05-25 NOTE — Progress Notes (Signed)
Physical Therapy Evaluation Patient Details Name: Stacey Henson MRN: [id number] DOB: 11/14/1956 Today's Date: 05/25/2020   History of Present Illness  Pt is a 64 yo female s/p L TKA, WBAT. PMH of headaches, hypothyroidism, interstitial cystitis, bladder surgery, lipidemia, arthritis.   Clinical Impression  Patient alert, agreeable to PT, reported pain in knee with exercises ~5/10. The patient was able to perform exercises with physical assistance for heel slides due to pain, verbal cueing. Sit <> stand from recliner with supervision and RW, able to ambulated ~6ft. Pt needed several standing rest breaks due to L knee pain, tended to flex forward and prop elbows on RW. Sit to supine performed with minA for LLE management. The patient would benefit from further skilled PT intervention to continue to progress towards goals. Recommendation remains appropriate.      Follow Up Recommendations Home health PT;Supervision/Assistance - 24 hour    Equipment Recommendations  Rolling walker with 5" wheels;Other (comment)    Recommendations for Other Services OT consult     Precautions / Restrictions Precautions Precautions: Knee Precaution Booklet Issued: No Restrictions Weight Bearing Restrictions: Yes LLE Weight Bearing: Weight bearing as tolerated      Mobility  Bed Mobility Overal bed mobility: Needs Assistance Bed Mobility: Sit to Supine       Sit to supine: Min assist;HOB elevated   General bed mobility comments: deferred up in recliner  Transfers Overall transfer level: Needs assistance Equipment used: Rolling walker (2 wheeled) Transfers: Sit to/from Stand Sit to Stand: Supervision         General transfer comment: from recliner and from standard commode with grab bar, cues for hand placement  Ambulation/Gait Ambulation/Gait assistance: Min guard;Supervision Gait Distance (Feet): 50 Feet Assistive device: Rolling walker (2 wheeled) Gait  Pattern/deviations: Step-to pattern Gait velocity: decreased   General Gait Details: Pt with several standing rest breaks due to pain. flexes and leans on RW with elbows  Stairs            Wheelchair Mobility    Modified Rankin (Stroke Patients Only)       Balance Overall balance assessment: Needs assistance Sitting-balance support: Feet supported Sitting balance-Leahy Scale: Normal Sitting balance - Comments: able to balance well on standard commode   Standing balance support: Bilateral upper extremity supported Standing balance-Leahy Scale: Fair Standing balance comment: pt able to reach within base of support with unilateral support on RW                             Pertinent Vitals/Pain Pain Assessment: 0-10 Pain Score: 5  Pain Location: L knee Pain Descriptors / Indicators: Burning;Jabbing;Discomfort;Pressure Pain Intervention(s): Limited activity within patient's tolerance;Monitored during session;Premedicated before session;Repositioned;Ice applied    Home Living                        Prior Function                 Hand Dominance        Extremity/Trunk Assessment                Communication      Cognition Arousal/Alertness: Awake/alert Behavior During Therapy: WFL for tasks assessed/performed Overall Cognitive Status: Within Functional Limits for tasks assessed  General Comments      Exercises Total Joint Exercises Ankle Circles/Pumps: AROM;Strengthening;Both;15 reps Short Arc Quad: AAROM;Strengthening;Left;10 reps Heel Slides: AAROM;Strengthening;Left;10 reps Hip ABduction/ADduction: AROM;Strengthening;Left;10 reps Knee Flexion: AAROM;Strengthening;Left;15 reps Goniometric ROM: 0 - 80deg   Assessment/Plan    PT Assessment    PT Problem List         PT Treatment Interventions      PT Goals (Current goals can be found in the Care Plan section)        Frequency BID   Barriers to discharge        Co-evaluation               AM-PAC PT "6 Clicks" Mobility  Outcome Measure Help needed turning from your back to your side while in a flat bed without using bedrails?: A Little Help needed moving from lying on your back to sitting on the side of a flat bed without using bedrails?: A Little Help needed moving to and from a bed to a chair (including a wheelchair)?: A Little Help needed standing up from a chair using your arms (e.g., wheelchair or bedside chair)?: A Little Help needed to walk in hospital room?: A Little Help needed climbing 3-5 steps with a railing? : A Little 6 Click Score: 18    End of Session Equipment Utilized During Treatment: Gait belt Activity Tolerance: Patient tolerated treatment well Patient left: in bed;with SCD's reapplied;with call bell/phone within reach Nurse Communication: Mobility status PT Visit Diagnosis: Difficulty in walking, not elsewhere classified (R26.2);Muscle weakness (generalized) (M62.81);Pain;Other abnormalities of gait and mobility (R26.89) Pain - Right/Left: Left Pain - part of body: Knee    Time: 8315-1761 PT Time Calculation (min) (ACUTE ONLY): 33 min   Charges:     PT Treatments $Gait Training: 8-22 mins $Therapeutic Exercise: 23-37 mins $Therapeutic Activity: 8-22 mins        Olga Coaster PT, DPT 3:35 PM,05/25/20

## 2020-05-25 NOTE — TOC Initial Note (Signed)
Transition of Care [facility]) - Initial/Assessment Note    Patient Details  Name: Stacey Henson MRN: [id number] Date of Birth: 12/03/1956  [facility] CM/SW Contact:    Anselm Pancoast, RN Phone Number: 05/25/2020, 3:06 PM  Clinical Narrative:                 Met with patient at the bedside. Patient was pleasant and sitting up in chair with visitors. Patient states she is planning discharge home tomorrow with a couple weeks of home PT prior to starting OP PT. Patient states PT was planning to get her a youth walker. Already has a shower seat and has ordered some grabbers from [employer]. Patient expressed concerns about getting her Ambien at bedtime. Confirmed patient is already engaged with [place] for home PT.   Expected Discharge Plan: [place] Barriers to Discharge: Continued Medical Work up   Patient Goals and CMS Choice Patient states their goals for this hospitalization and ongoing recovery are:: Get home with [facility] for continued therapy      Expected Discharge Plan and Services Expected Discharge Plan: [place]       Living arrangements for the past 2 months: Single Family Home                                      Prior Living Arrangements/Services Living arrangements for the past 2 months: Single Family Home Lives with:: Self(Family assists as needed) Patient language and need for interpreter reviewed:: Yes Do you feel safe going back to the place where you live?: Yes      Need for Family Participation in Patient Care: Yes (Comment) Care giver support system in place?: Yes (comment)   Criminal Activity/Legal Involvement Pertinent to Current Situation/Hospitalization: No - Comment as needed  Activities of Daily Living Home Assistive Devices/Equipment: Cane (specify quad or straight) ADL Screening (condition at time of admission) Patient's cognitive ability adequate to safely complete daily activities?:  Yes Is the patient deaf or have difficulty hearing?: No Does the patient have difficulty seeing, even when wearing glasses/contacts?: No Does the patient have difficulty concentrating, remembering, or making decisions?: No Patient able to express need for assistance with ADLs?: Yes Does the patient have difficulty dressing or bathing?: No Independently performs ADLs?: Yes (appropriate for developmental age) Does the patient have difficulty walking or climbing stairs?: Yes Weakness of Legs: Left Weakness of Arms/Hands: None  Permission Sought/Granted Permission sought to share information with : Case Manager Permission granted to share information with : Yes, Verbal Permission Granted  Share Information with NAME: TOC Department           Emotional Assessment Appearance:: Appears stated age Attitude/Demeanor/Rapport: Engaged Affect (typically observed): Accepting Orientation: : Oriented to Self, Oriented to Place, Oriented to  Time, Oriented to Situation Alcohol / Substance Use: Never Used Psych Involvement: No (comment)  Admission diagnosis:  Knee joint replacement status, left [Z96.652] Patient Active Problem List   Diagnosis Date Noted  . Knee joint replacement status, left 05/24/2020   PCP:  [facility], [place] Primary Pharmacy:   [id number] [address], [address] [place] [address] Phone: [phone] Fax: [phone]     Social Determinants of Health (SDOH) Interventions    Readmission Risk Interventions No flowsheet data  found.  

## 2020-05-25 NOTE — Evaluation (Signed)
Occupational Therapy Evaluation Patient Details Name: Stacey Henson MRN: [id number] DOB: Jul 13, 1956 Today's Date: 05/25/2020    History of Present Illness Pt is a 64 yo female s/p L TKA, WBAT. PMH of PMH of headaches, hypothyroidism, interstitial cystitis, bladder surgery, lipidemia, arthritis   Clinical Impression   Pt seen for OT evaluation this date, POD1 from above surgery. She presents this morning awake, alert and motivated to work with therapy. Pt was independent in all ADL/IADL prior to surgery, only using a SPC briefly after tripping over a dog toy a few weeks ago. She still drives, works from home (on leave until 6/14) and has multiple family members who live nearby for support. Pt currently is Mod I with HOB elevated and increased time for bed mobility, Supervision using RW for t/f and functional mobility and Supervision with increased time and RW for LB dressing. Pt educated on compression stocking mgt, polar care mgt, falls prevention strategies, safe AE/DME use for functional activity and home setup. She was receptive and engaged throughout with independent problem solving skills; handout provided for carryover. Given pt problem solving skills, functional performance this session and home support system, pt is appropriate for discharge from OT services and no anticipated OT needs following hospitalization.      Follow Up Recommendations  No OT follow up    Equipment Recommendations  None recommended by OT    Recommendations for Other Services       Precautions / Restrictions Precautions Precautions: Knee Restrictions Weight Bearing Restrictions: Yes LLE Weight Bearing: Weight bearing as tolerated      Mobility Bed Mobility Overal bed mobility: Needs Assistance Bed Mobility: Supine to Sit     Supine to sit: HOB elevated;Modified independent (Device/Increase time)     General bed mobility comments: increased time  Transfers Overall transfer level:  Needs assistance Equipment used: Rolling walker (2 wheeled) Transfers: Sit to/from Stand Sit to Stand: Supervision         General transfer comment: completed sit <> stand t/f from EOB to recliner without unsteadiness or LOB, good strength and endurance noted    Balance Overall balance assessment: Needs assistance Sitting-balance support: Feet supported Sitting balance-Leahy Scale: Normal Sitting balance - Comments: maintained steady static and dynamic balance at EOB, no LOB when reaching outside BOS to don/doff sock   Standing balance support: Bilateral upper extremity supported Standing balance-Leahy Scale: Fair Standing balance comment: Pt used RW for BUE support when standing and during ambulation to recliner, steady and stable movement throughout                           ADL either performed or assessed with clinical judgement   ADL Overall ADL's : Needs assistance/impaired                     Lower Body Dressing: Sit to/from stand;With adaptive equipment;Supervision/safety(using RW) Lower Body Dressing Details (indicate cue type and reason): Per clinical reasoning. Pt donned/doffed sock without physical assist, using RW as support. She completed sit <> stand using RW with supervision and no noted unsteadiness. Min cueing for hand placement. Toilet Transfer: Teacher, adult education;Ambulation;Supervision/safety Toilet Transfer Details (indicate cue type and reason): Per clinical obs, pt completed sit <> stand from EOB to recliner with RW and supervision.         Functional mobility during ADLs: Supervision/safety;Rolling walker General ADL Comments: Pt demonstrated overall functional mobility, strength and ROM for safe ADL completion. She  has a plan for sleeping and showering upon discharge. Will benefit from supervision for safety/support.     Vision Baseline Vision/History: Wears glasses Wears Glasses: Reading only Vision Assessment?: No apparent visual  deficits     Perception     Praxis      Pertinent Vitals/Pain Pain Score: 2 (at rest in bed) Pain Descriptors / Indicators: Burning;Jabbing;Discomfort Pain Intervention(s): Limited activity within patient's tolerance;Monitored during session;Ice applied     Hand Dominance     Extremity/Trunk Assessment Upper Extremity Assessment Upper Extremity Assessment: Overall WFL for tasks assessed   Lower Extremity Assessment Lower Extremity Assessment: LLE deficits/detail LLE Deficits / Details: Anticipated post-op deficits in LLE strength/ROM       Communication Communication Communication: No difficulties   Cognition Arousal/Alertness: Awake/alert Behavior During Therapy: WFL for tasks assessed/performed Overall Cognitive Status: Within Functional Limits for tasks assessed                                 General Comments: Pt independently illustrates great problem solving skills, planning ahead and strategizing   General Comments       Exercises Other Exercises Other Exercises: Pt educated on compression stocking mgt, polar care mgt, falls prevention strategies, safe AE/DME use for functional activity and home setup. She was receptive and engaged throughout with independent problem solving skills; handout provided for carryover.   Shoulder Instructions      Home Living Family/patient expects to be discharged to:: Private residence Living Arrangements: Alone Available Help at Discharge: Available 24 hours/day;Family(son, 2 daughters and granddaughter, 42 yo dad lives across the street) Type of Home: House Home Access: Stairs to enter Technical brewer of Steps: 5+3 Entrance Stairs-Rails: Right;Left Home Layout: One level     Bathroom Shower/Tub: Crump: Environmental consultant - 4 wheels;Grab bars - toilet;Grab bars - tub/shower;Shower seat   Additional Comments: Pt's bathroom is 4 steps from her room      Prior  Functioning/Environment Level of Independence: Independent        Comments: Pt briefly used a SPC after tripping over a dog toy a few weeks ago. Currently works from home (on leave until 6/14) and still drives. Has difficulties with bladder mgt at baseline 2/2 other PMH.        OT Problem List: Decreased strength;Decreased range of motion;Impaired balance (sitting and/or standing);Decreased knowledge of use of DME or AE;Pain      OT Treatment/Interventions:      OT Goals(Current goals can be found in the care plan section) Acute Rehab OT Goals Patient Stated Goal: To go home soon with more functional knee OT Goal Formulation: All assessment and education complete, DC therapy  OT Frequency:     Barriers to D/C:            Co-evaluation              AM-PAC OT "6 Clicks" Daily Activity     Outcome Measure Help from another person eating meals?: None Help from another person taking care of personal grooming?: None Help from another person toileting, which includes using toliet, bedpan, or urinal?: A Little Help from another person bathing (including washing, rinsing, drying)?: A Little Help from another person to put on and taking off regular upper body clothing?: None Help from another person to put on and taking off regular lower body clothing?: None 6 Click Score: 22  End of Session Equipment Utilized During Treatment: Gait belt;Rolling walker  Activity Tolerance: Patient tolerated treatment well Patient left: in chair(with PT beginning session)  OT Visit Diagnosis: Other abnormalities of gait and mobility (R26.89);Pain Pain - Right/Left: Left Pain - part of body: Knee                Time: 0737-1062 OT Time Calculation (min): 27 min Charges:  OT General Charges $OT Visit: 1 Visit OT Evaluation $OT Eval Low Complexity: 1 Low OT Treatments $Self Care/Home Management : 8-22 mins  Maurilio Lovely, OTS 05/25/20, 10:51 AM

## 2020-05-25 NOTE — Progress Notes (Signed)
Physical Therapy Evaluation Patient Details Name: Stacey Henson MRN: [id number] DOB: Nov 02, 1956 Today's Date: 05/25/2020   History of Present Illness  Pt is a 64 yo female s/p L TKA, WBAT. PMH of headaches, hypothyroidism, interstitial cystitis, bladder surgery, lipidemia, arthritis.   Clinical Impression  Pt alert, sitting in recliner with OT at bedside. Pt reported L knee pain 2/10 at rest, 8-9/10 pain with ambulation. Pt performed several transfers during session from recliner and standard commode, supervision with cueing for hand placement. The patient ambulated ~64ft total, seated rest break due to L leg pain and UE fatigue. Pt exhibited step to gait pattern, verbalized and demonstrated understanding of technique, no LOB noted but cued for safe activity pacing. The patient would benefit from further skilled PT intervention to continue to progress towards goals. Recommendation remains appropriate.      Follow Up Recommendations Home health PT;Supervision/Assistance - 24 hour    Equipment Recommendations  Rolling walker with 5" wheels;Other (comment)    Recommendations for Other Services OT consult     Precautions / Restrictions Precautions Precautions: Knee Precaution Booklet Issued: No Restrictions Weight Bearing Restrictions: Yes LLE Weight Bearing: Weight bearing as tolerated      Mobility  Bed Mobility     General bed mobility comments: deferred up in recliner  Transfers Overall transfer level: Needs assistance Equipment used: Rolling walker (2 wheeled) Transfers: Sit to/from Stand Sit to Stand: Supervision         General transfer comment: from recliner and from standard commode with grab bar, cues for hand placement  Ambulation/Gait Ambulation/Gait assistance: Min guard;Supervision Gait Distance (Feet): 40 Feet Assistive device: Rolling walker (2 wheeled) Gait Pattern/deviations: Step-to pattern Gait velocity: decreased      Stairs            Wheelchair Mobility    Modified Rankin (Stroke Patients Only)       Balance Overall balance assessment: Needs assistance Sitting-balance support: Feet supported Sitting balance-Leahy Scale: Normal Sitting balance - Comments: able to balance well on standard commode   Standing balance support: Bilateral upper extremity supported Standing balance-Leahy Scale: Fair Standing balance comment: pt able to reach within base of support with unilateral support on RW                             Pertinent Vitals/Pain Pain Assessment: 0-10 Pain Score: 9  Pain Location: with ambulation Pain Descriptors / Indicators: Burning;Jabbing;Discomfort;Pressure Pain Intervention(s): Limited activity within patient's tolerance;Monitored during session;Premedicated before session;Repositioned;Ice applied    Home Living Family/patient expects to be discharged to:: Private residence Living Arrangements: Alone Available Help at Discharge: Available 24 hours/day;Family(son, 2 daughters and granddaughter, 71 yo dad lives across the street) Type of Home: House Home Access: Stairs to enter Entrance Stairs-Rails: Psychiatric nurse of Steps: 5+3 Home Layout: One level Home Equipment: Walker - 4 wheels;Grab bars - toilet;Grab bars - tub/shower;Shower seat Additional Comments: Pt's bathroom is 4 steps from her room    Prior Function Level of Independence: Independent         Comments: Pt briefly used a SPC after tripping over a dog toy a few weeks ago. Currently works from home (on leave until 6/14) and still drives. Has difficulties with bladder mgt at baseline 2/2 other PMH.     Hand Dominance        Extremity/Trunk Assessment   Upper Extremity Assessment Upper Extremity Assessment: Overall WFL for tasks assessed  Lower Extremity Assessment Lower Extremity Assessment: LLE deficits/detail LLE Deficits / Details: Anticipated post-op deficits in LLE  strength/ROM       Communication   Communication: No difficulties  Cognition Arousal/Alertness: Awake/alert Behavior During Therapy: WFL for tasks assessed/performed Overall Cognitive Status: Within Functional Limits for tasks assessed                                 General Comments: Pt independently illustrates great problem solving skills, planning ahead and strategizing      General Comments      Exercises Other Exercises Other Exercises: Pt educated on compression stocking mgt, polar care mgt, falls prevention strategies, safe AE/DME use for functional activity and home setup. She was receptive and engaged throughout with independent problem solving skills; handout provided for carryover.   Assessment/Plan    PT Assessment    PT Problem List         PT Treatment Interventions      PT Goals (Current goals can be found in the Care Plan section)  Acute Rehab PT Goals Patient Stated Goal: To go home soon with more functional knee    Frequency BID   Barriers to discharge        Co-evaluation               AM-PAC PT "6 Clicks" Mobility  Outcome Measure Help needed turning from your back to your side while in a flat bed without using bedrails?: A Little Help needed moving from lying on your back to sitting on the side of a flat bed without using bedrails?: A Little Help needed moving to and from a bed to a chair (including a wheelchair)?: A Little Help needed standing up from a chair using your arms (e.g., wheelchair or bedside chair)?: A Little Help needed to walk in hospital room?: A Little Help needed climbing 3-5 steps with a railing? : A Little 6 Click Score: 18    End of Session Equipment Utilized During Treatment: Gait belt Activity Tolerance: Patient tolerated treatment well Patient left: in chair;with call bell/phone within reach Nurse Communication: Mobility status PT Visit Diagnosis: Difficulty in walking, not elsewhere classified  (R26.2);Muscle weakness (generalized) (M62.81);Pain;Other abnormalities of gait and mobility (R26.89) Pain - Right/Left: Left Pain - part of body: Knee    Time: 4235-3614 PT Time Calculation (min) (ACUTE ONLY): 26 min   Charges:     PT Treatments $Gait Training: 8-22 mins $Therapeutic Activity: 8-22 mins       Olga Coaster PT, DPT 12:33 PM,05/25/20

## 2020-05-25 NOTE — Progress Notes (Signed)
   Subjective: 1 Day Post-Op Procedure(s) (LRB): LEFT TOTAL KNEE ARTHROPLASTY (Left) Patient reports pain as mild.   Patient is well, and has had no acute complaints or problems Denies any CP, SOB, ABD pain. We will continue therapy today.  Plan is to go Home after hospital stay.  Objective: Vital signs in last 24 hours: Temp:  [97.2 F (36.2 C)-97.7 F (36.5 C)] 97.6 F (36.4 C) (05/28 0600) Pulse Rate:  [95-97] 97 (05/28 0600) Resp:  [16-18] 16 (05/28 0600) BP: (121-134)/(66-99) 121/73 (05/28 0600) SpO2:  [96 %] 96 % (05/28 0600)  Intake/Output from previous day: 05/27 0701 - 05/28 0700 In: 1670 [P.O.:600; I.V.:1070] Out: 2475 [Urine:2375; Blood:100] Intake/Output this shift: No intake/output data recorded.  Recent Labs    05/24/20 1442 05/25/20 0602  HGB 12.8 11.3*   Recent Labs    05/24/20 1442 05/25/20 0602  WBC 9.7 12.4*  RBC 4.56 4.01  HCT 39.7 34.1*  PLT 237 220   Recent Labs    05/24/20 1442 05/25/20 0602  NA  --  141  K  --  4.5  CL  --  106  CO2  --  27  BUN  --  16  CREATININE 0.69 0.58  GLUCOSE  --  128*  CALCIUM  --  8.9   No results for input(s): LABPT, INR in the last 72 hours.  EXAM General - Patient is Alert, Appropriate and Oriented Extremity - Neurovascular intact Sensation intact distally Intact pulses distally Dorsiflexion/Plantar flexion intact No cellulitis present Compartment soft Dressing - dressing C/D/I and no drainage, prevena intact with out drainage Motor Function - intact, moving foot and toes well on exam.   Past Medical History:  Diagnosis Date  . Arthritis   . Bladder spasms   . Complication of anesthesia   . Headache    H/O MIGRAINES  . Hypothyroidism   . IC (interstitial cystitis)   . Lipidemia   . PONV (postoperative nausea and vomiting)    X 2  . Vertigo    DUE TO ALLERGIES    Assessment/Plan:   1 Day Post-Op Procedure(s) (LRB): LEFT TOTAL KNEE ARTHROPLASTY (Left) Active Problems:   Knee  joint replacement status, left  Estimated body mass index is 44 kg/m as calculated from the following:   Height as of this encounter: 5' (1.524 m).   Weight as of this encounter: 102.2 kg. Advance diet Up with therapy  Needs BM Pain well controlled VSS Labs stable CM To assist with discharge to home with HHPT  DVT Prophylaxis - Lovenox, Foot Pumps and TED hose Weight-Bearing as tolerated to left leg   T. Cranston Neighbor, PA-C [facility] 05/25/2020, 12:09 PM

## 2020-05-26 DIAGNOSIS — M1712 Unilateral primary osteoarthritis, left knee: Secondary | ICD-10-CM | POA: Diagnosis not present

## 2020-05-26 MED ORDER — ENOXAPARIN SODIUM 40 MG/0.4ML ~~LOC~~ SOLN: 40.0000 mg | SUBCUTANEOUS | 0 refills | Status: DC

## 2020-05-26 MED ORDER — ONDANSETRON HCL 4 MG PO TABS: 4.0000 mg | ORAL_TABLET | Freq: Four times a day (QID) | ORAL | 0 refills | Status: DC | PRN

## 2020-05-26 MED ORDER — TRAMADOL HCL 50 MG PO TABS
50.0000 mg | ORAL_TABLET | Freq: Four times a day (QID) | ORAL | Status: DC | PRN
  Administered 2020-05-26 – 2020-05-29 (×3): 100 mg via ORAL
  Filled 2020-05-26 (×3): qty 2

## 2020-05-26 MED ORDER — TRAMADOL HCL 50 MG PO TABS: 50.0000 mg | ORAL_TABLET | Freq: Four times a day (QID) | ORAL | 0 refills | Status: DC | PRN

## 2020-05-26 MED ORDER — DOCUSATE SODIUM 100 MG PO CAPS: 100.0000 mg | ORAL_CAPSULE | Freq: Two times a day (BID) | ORAL | 0 refills | Status: DC

## 2020-05-26 MED ORDER — METHOCARBAMOL 500 MG PO TABS: 500.0000 mg | ORAL_TABLET | Freq: Four times a day (QID) | ORAL | 0 refills | Status: DC | PRN

## 2020-05-26 MED ORDER — HYDROCODONE-ACETAMINOPHEN 5-325 MG PO TABS
1.0000 | ORAL_TABLET | ORAL | Status: DC | PRN
  Administered 2020-05-26: 2 via ORAL
  Administered 2020-05-26 – 2020-05-27 (×3): 1 via ORAL
  Administered 2020-05-27: 2 via ORAL
  Administered 2020-05-27 – 2020-05-28 (×4): 1 via ORAL
  Filled 2020-05-26 (×6): qty 1
  Filled 2020-05-26: qty 2
  Filled 2020-05-26: qty 1
  Filled 2020-05-26: qty 2

## 2020-05-26 NOTE — Discharge Summary (Addendum)
Physician Discharge Summary  Patient ID: Stacey Henson MRN: [id number] DOB/AGE: 62-Jul-1957 88 y.o.  Admit date: 05/24/2020 Discharge date: 05/29/2020 Admission Diagnoses:  Knee joint replacement status, left [Z96.652]   Discharge Diagnoses: Patient Active Problem List   Diagnosis Date Noted  . Knee joint replacement status, left 05/24/2020    Past Medical History:  Diagnosis Date  . Arthritis   . Bladder spasms   . Complication of anesthesia   . Headache    H/O MIGRAINES  . Hypothyroidism   . IC (interstitial cystitis)   . Lipidemia   . PONV (postoperative nausea and vomiting)    X 2  . Vertigo    DUE TO ALLERGIES     Transfusion: none   Consultants (if any):   Discharged Condition: Improved  Hospital Course: Stacey Henson is an 65 y.o. female who was admitted 05/24/2020 with a diagnosis of left knee osteoarthritis and went to the operating room on 05/24/2020 and underwent the above named procedures.    Surgeries: Procedure(s): LEFT TOTAL KNEE ARTHROPLASTY on 05/24/2020 Patient tolerated the surgery well. Taken to PACU where she was stabilized and then transferred to the orthopedic floor.  Started on Lovenox 40 mg q 24 hrs. Foot pumps applied bilaterally at 80 mm. Heels elevated on bed with rolled towels. No evidence of DVT. Negative Homan. Physical therapy started on day #1 for gait training and transfer. OT started day #1 for ADL and assisted devices.  Patient's foley was d/c on day #1. Patient's IV was d/c on day #2.  On post op day #3 patient  Made slow progress with PT, SNF was recommended and authorization process started. On post op day 5, insurance auth was obtaiend and patient was stable and ready for discharge to SNF  Implants: Medacta GMK sphere system with left 2+femur, left 2tibia with short stem and 53mm insert. Size 2patella, all components cemented  She was given perioperative antibiotics:  Anti-infectives (From  admission, onward)   Start     Dose/Rate Route Frequency Ordered Stop   05/24/20 2253  ceFAZolin (ANCEF) 2-4 GM/100ML-% IVPB    Note to Pharmacy: Gwynneth Aliment   : cabinet override      05/24/20 2253 05/24/20 2335   05/24/20 1712  ceFAZolin (ANCEF) 2-4 GM/100ML-% IVPB    Note to Pharmacy: Rayann Heman   : cabinet override      05/24/20 1712 05/25/20 0514   05/24/20 1000  ceFAZolin (ANCEF) IVPB 2g/100 mL premix     2 g 200 mL/hr over 30 Minutes Intravenous Every 6 hours 05/24/20 0949 05/25/20 0002   05/24/20 0615  ceFAZolin (ANCEF) IVPB 2g/100 mL premix     2 g 200 mL/hr over 30 Minutes Intravenous On call to O.R. 05/24/20 5681 05/24/20 0812   05/24/20 0614  ceFAZolin (ANCEF) 2-4 GM/100ML-% IVPB    Note to Pharmacy: Dellis Filbert   : cabinet override      05/24/20 2751 05/24/20 0757    .  She was given sequential compression devices, early ambulation, and Lovenox TEDs for DVT prophylaxis.  She benefited maximally from the hospital stay and there were no complications.    Recent vital signs:  Vitals:   05/25/20 2252 05/26/20 0746  BP: (!) 141/73 (!) 121/99  Pulse: 93 (!) 103  Resp: 18 17  Temp: 98.3 F (36.8 C) 98.7 F (37.1 C)  SpO2: 100% 96%    Recent laboratory studies:  Lab Results  Component Value Date   HGB 11.3 (L)  05/25/2020   HGB 12.8 05/24/2020   HGB 13.3 05/18/2020   Lab Results  Component Value Date   WBC 12.4 (H) 05/25/2020   PLT 220 05/25/2020   No results found for: INR Lab Results  Component Value Date   NA 141 05/25/2020   K 4.5 05/25/2020   CL 106 05/25/2020   CO2 27 05/25/2020   BUN 16 05/25/2020   CREATININE 0.58 05/25/2020   GLUCOSE 128 (H) 05/25/2020    Discharge Medications:   Allergies as of 05/26/2020      Reactions   Morphine And Related Nausea And Vomiting   Projectile vomitting      Medication List    STOP taking these medications   HYDROcodone-acetaminophen 5-325 MG tablet Commonly known as: NORCO/VICODIN      TAKE these medications   amitriptyline 50 MG tablet Commonly known as: ELAVIL Take 50 mg by mouth at bedtime.   amoxicillin-clavulanate 875-125 MG tablet Commonly known as: AUGMENTIN Take 1 tablet by mouth every 12 (twelve) hours.   atorvastatin 40 MG tablet Commonly known as: LIPITOR Take 40 mg by mouth every morning.   cetirizine 10 MG tablet Commonly known as: ZYRTEC Take 1 tablet (10 mg total) by mouth daily. If needed at night for itching not relieved by Claritin in the morning.   cholecalciferol 25 MCG (1000 UNIT) tablet Commonly known as: VITAMIN D3 Take 1,000 Units by mouth daily.   diphenhydrAMINE 25 MG tablet Commonly known as: BENADRYL Take 25 mg by mouth as needed.   docusate sodium 100 MG capsule Commonly known as: COLACE Take 1 capsule (100 mg total) by mouth 2 (two) times daily.   enoxaparin 40 MG/0.4ML injection Commonly known as: Lovenox Inject 0.4 mLs (40 mg total) into the skin daily for 14 days.   levothyroxine 50 MCG tablet Commonly known as: SYNTHROID Take 50 mcg by mouth daily before breakfast.   loratadine 10 MG tablet Commonly known as: Claritin Take 1 tablet (10 mg total) by mouth daily. Take 1 tablet in the morning. As needed for itching.   methocarbamol 500 MG tablet Commonly known as: ROBAXIN Take 1 tablet (500 mg total) by mouth every 6 (six) hours as needed for muscle spasms.   multivitamin with minerals Tabs tablet Take 1 tablet by mouth daily.   ondansetron 4 MG tablet Commonly known as: ZOFRAN Take 1 tablet (4 mg total) by mouth every 6 (six) hours as needed for nausea.   Potassium 99 MG Tabs Take 99 mg by mouth at bedtime.   ranitidine 150 MG capsule Commonly known as: ZANTAC Take 1 capsule (150 mg total) by mouth 2 (two) times daily.   traMADol 50 MG tablet Commonly known as: ULTRAM Take 1-2 tablets (50-100 mg total) by mouth every 6 (six) hours as needed for moderate pain.   trospium 20 MG tablet Commonly known as:  SANCTURA Take 20 mg by mouth 2 (two) times daily.   zolpidem 10 MG tablet Commonly known as: AMBIEN Take 10 mg by mouth at bedtime.            Durable Medical Equipment  (From admission, onward)         Start     Ordered   05/24/20 0950  DME 3 n 1  Once     05/24/20 0949   05/24/20 0950  DME Bedside commode  Once    Question:  Patient needs a bedside commode to treat with the following condition  Answer:  Knee joint replacement  status, left   05/24/20 0949   05/24/20 0950  DME Walker rolling  Once    Question Answer Comment  Walker: With 5 Inch Wheels   Patient needs a walker to treat with the following condition Knee joint replacement status, left      05/24/20 0949          Diagnostic Studies: DG Knee 1-2 Views Left  Result Date: 05/24/2020 CLINICAL DATA:  Status post total knee arthroplasty EXAM: LEFT KNEE - 1-2 VIEW COMPARISON:  None. FINDINGS: Frontal and lateral views were obtained. Patient is status post total knee replacement with femoral and tibial prosthetic components well-seated. No fracture or dislocation. No erosive change. Intra-articular air is an expected postoperative finding. IMPRESSION: Prosthetic components well-seated. No fracture or dislocation. Acute postoperative changes noted. Electronically Signed   By: Lowella Grip III M.D.   On: 05/24/2020 10:13    Disposition:     Follow-up Information    Duanne Guess, PA-C Follow up in 2 week(s).   Specialties: Orthopedic Surgery, Emergency Medicine Contact information: [place] [address] [phone]            Signed: Feliberto Gottron 05/26/2020, 9:25 AM

## 2020-05-26 NOTE — Progress Notes (Signed)
Physical Therapy Treatment Patient Details Name: Stacey Henson MRN: [id number] DOB: 1956/12/07 Today's Date: 05/26/2020    History of Present Illness Pt is a 64 yo female s/p L TKA, WBAT. PMH of headaches, hypothyroidism, interstitial cystitis, bladder surgery, lipidemia, arthritis    PT Comments    Pt was long sitting in bed upon arriving. She agrees to PT session and is cooperative and pleasant. Pt reports she is in much more pain this date. Session progression limited by pt's pain. " I did not hurt yesterday. Its hurst so bad today." reports 6/10 pain. Pt was able to exit bed yesterday without physical assist..05/26/20 required mod assist. CGA for safety to stand and ambulate 3 ft to [facility] prior to standing and ambulating 25 ft with RW. She had to stop and take 3 standing rest during 25 ft gait trial. Several times leaning on RW with forearms 2/2 to pain. Recliner had to be placed behind pt 2/2 to inability to continue ambulation further distance due to pain. Unsafe to trial stairs. Pt has DC orders in to DC to home. PA notified of pt's limitations. Therapist will return for 2nd session once pain is improved. Pt was seated in recliner with towel roll under L heel to promote ext, call bell in reach, polar care applied, and chair alarm in place. Will need 2nd session and stair training prior to safe DC to home.    Follow Up Recommendations  Home health PT;Supervision/Assistance - 24 hour     Equipment Recommendations  Rolling walker with 5" wheels;Other (comment)    Recommendations for Other Services       Precautions / Restrictions Precautions Precautions: Knee Precaution Booklet Issued: No Restrictions Weight Bearing Restrictions: Yes LLE Weight Bearing: Weight bearing as tolerated    Mobility  Bed Mobility Overal bed mobility: Needs Assistance Bed Mobility: Supine to Sit     Supine to sit: HOB elevated;Mod assist     General bed mobility comments: pt required mod  assist this date 2/2 to pain. Pt very limited throughout session2/2 to pain  Transfers Overall transfer level: Needs assistance Equipment used: Rolling walker (2 wheeled) Transfers: Sit to/from Stand Sit to Stand: Min guard         General transfer comment: CGA for safety with vcs thorughout for technique  Ambulation/Gait Ambulation/Gait assistance: Min guard;Min assist Gait Distance (Feet): 25 Feet Assistive device: Rolling walker (2 wheeled) Gait Pattern/deviations: Step-to pattern;Trunk flexed;Antalgic;Decreased step length - right;Decreased stance time - left Gait velocity: decreased   General Gait Details: Pt unable to tolerate ambulation 2/2 to pain. She was very limited. unable to stay erect with leaning on RW with forarms several times 2/2 to pain   Stairs             Wheelchair Mobility    Modified Rankin (Stroke Patients Only)       Balance                                            Cognition Arousal/Alertness: Awake/alert Behavior During Therapy: WFL for tasks assessed/performed Overall Cognitive Status: Within Functional Limits for tasks assessed                                 General Comments: Pt is A and O x 4  Exercises      General Comments        Pertinent Vitals/Pain Pain Assessment: 0-10 Pain Score: 6  Faces Pain Scale: Hurts little more Pain Location: L knee Pain Descriptors / Indicators: Burning;Jabbing;Discomfort;Pressure Pain Intervention(s): Limited activity within patient's tolerance;Monitored during session;Repositioned;Ice applied    Home Living                      Prior Function            PT Goals (current goals can now be found in the care plan section) Acute Rehab PT Goals Patient Stated Goal: To go home soon with more functional knee Progress towards PT goals: Not progressing toward goals - comment(pain limited session)    Frequency    BID      PT Plan  Current plan remains appropriate    Co-evaluation              AM-PAC PT "6 Clicks" Mobility   Outcome Measure  Help needed turning from your back to your side while in a flat bed without using bedrails?: A Little Help needed moving from lying on your back to sitting on the side of a flat bed without using bedrails?: A Little Help needed moving to and from a bed to a chair (including a wheelchair)?: A Little Help needed standing up from a chair using your arms (e.g., wheelchair or bedside chair)?: A Little Help needed to walk in hospital room?: A Little Help needed climbing 3-5 steps with a railing? : A Lot 6 Click Score: 17    End of Session Equipment Utilized During Treatment: Gait belt Activity Tolerance: Patient limited by pain Patient left: in chair;with call bell/phone within reach;with chair alarm set;with family/visitor present Nurse Communication: Mobility status PT Visit Diagnosis: Difficulty in walking, not elsewhere classified (R26.2);Muscle weakness (generalized) (M62.81);Pain;Other abnormalities of gait and mobility (R26.89) Pain - Right/Left: Left Pain - part of body: Knee     Time: 1093-2355 PT Time Calculation (min) (ACUTE ONLY): 43 min  Charges:  $Gait Training: 8-22 mins $Therapeutic Activity: 23-37 mins                     Julaine Fusi PTA 05/26/20, 11:05 AM

## 2020-05-26 NOTE — Progress Notes (Signed)
Physical Therapy Treatment Patient Details Name: Stacey Henson MRN: [id number] DOB: 1956-06-23 Today's Date: 05/26/2020    History of Present Illness Pt is a 64 yo female s/p L TKA, WBAT. PMH of headaches, hypothyroidism, interstitial cystitis, bladder surgery, lipidemia, arthritis    PT Comments    Pt was long sitting in bed upon arriving. New pain med orders from previous session. Pt was pre-medicated prior to session. Reports 7/10 pain with ambulation/wt bearing. She required min assist to exit/re-enter bed but CGA for transfers and ambulation. She was able to ambulate 50 ft with recliner follow and several standing rest. Tends to lean on forearms to rest even with vcs for maintaining upright posture. Seated rest after 50 ft then ambulated 2nd trial of 50 ft. Continues to be limited by pain rather than strength or physical abilities. Pt has not had BM. PT will continue to follow per current POC. Will progress as able. She was repositioned in bed at conclusion of session with HEP handout issued and pt recommended to perform throughout the day. Pt is in bed with call bell in reach, polar care applied, towel roll under LLE to promote knee ext, and daughter at bedside. Pt will need stair training prior to DC home. Attempted to perform in AM session this date however unsafe 2/2 to pain.    Follow Up Recommendations  Home health PT;Supervision/Assistance - 24 hour     Equipment Recommendations  Rolling walker with 5" wheels;Other (comment)    Recommendations for Other Services       Precautions / Restrictions Precautions Precautions: Knee Precaution Booklet Issued: Yes (comment) Precaution Comments: issued HEP handout Restrictions Weight Bearing Restrictions: Yes LLE Weight Bearing: Weight bearing as tolerated    Mobility  Bed Mobility Overal bed mobility: Needs Assistance Bed Mobility: Supine to Sit     Supine to sit: HOB elevated;Min assist Sit to supine: Min  assist   General bed mobility comments: Min assist to exit and re-enter bed. vcs for improved technique and sequneicng.  Transfers Overall transfer level: Needs assistance Equipment used: Rolling walker (2 wheeled) Transfers: Sit to/from Stand Sit to Stand: Supervision            Ambulation/Gait Ambulation/Gait assistance: Min guard Gait Distance (Feet): 50 Feet Assistive device: Rolling walker (2 wheeled) Gait Pattern/deviations: Step-to pattern;Trunk flexed;Antalgic;Decreased step length - right;Decreased stance time - left Gait velocity: decreased   General Gait Details: pt was able to improve gait distances however continues to be limited by pain. stops several times with leaning on forearms for rest.   Stairs             Wheelchair Mobility    Modified Rankin (Stroke Patients Only)       Balance Overall balance assessment: Needs assistance Sitting-balance support: Feet supported Sitting balance-Leahy Scale: Normal     Standing balance support: Bilateral upper extremity supported;During functional activity Standing balance-Leahy Scale: Fair Standing balance comment: heavy use of RW for dynamic standing balance                            Cognition Arousal/Alertness: Awake/alert Behavior During Therapy: WFL for tasks assessed/performed Overall Cognitive Status: Within Functional Limits for tasks assessed                                 General Comments: Pt is A and O x 4  Exercises      General Comments        Pertinent Vitals/Pain Pain Assessment: No/denies pain Pain Score: 6  Pain Location: L knee Pain Descriptors / Indicators: Burning;Jabbing;Discomfort;Pressure Pain Intervention(s): Monitored during session;Limited activity within patient's tolerance;Premedicated before session;Repositioned;Ice applied    Home Living                      Prior Function            PT Goals (current goals can  now be found in the care plan section) Acute Rehab PT Goals Patient Stated Goal: to go home safely Progress towards PT goals: Progressing toward goals(botyh PT sessions this date limited by pain)    Frequency    BID      PT Plan Current plan remains appropriate    Co-evaluation              AM-PAC PT "6 Clicks" Mobility   Outcome Measure  Help needed turning from your back to your side while in a flat bed without using bedrails?: A Little Help needed moving from lying on your back to sitting on the side of a flat bed without using bedrails?: A Little Help needed moving to and from a bed to a chair (including a wheelchair)?: A Little Help needed standing up from a chair using your arms (e.g., wheelchair or bedside chair)?: A Little Help needed to walk in hospital room?: A Little Help needed climbing 3-5 steps with a railing? : A Lot 6 Click Score: 17    End of Session Equipment Utilized During Treatment: Gait belt Activity Tolerance: Patient limited by pain Patient left: in bed;with call bell/phone within reach;with bed alarm set;with family/visitor present;with SCD's reapplied;with nursing/sitter in room Nurse Communication: Mobility status PT Visit Diagnosis: Difficulty in walking, not elsewhere classified (R26.2);Muscle weakness (generalized) (M62.81);Pain;Other abnormalities of gait and mobility (R26.89) Pain - Right/Left: Left Pain - part of body: Knee     Time: 7591-6384 PT Time Calculation (min) (ACUTE ONLY): 28 min  Charges:  $Gait Training: 8-22 mins $Therapeutic Activity: 8-22 mins                     Julaine Fusi PTA 05/26/20, 4:20 PM

## 2020-05-26 NOTE — Discharge Instructions (Signed)

## 2020-05-26 NOTE — Progress Notes (Signed)
PT Cancellation Note  Patient Details Name: Mikayah Joy MRN: [id number] DOB: 02/16/1956   Cancelled Treatment:     PT attempt. Returned 30 minutes after pain meds given however pt is unable to tolerate lowering foot rest of recliner.10/10 pain. Therapist to discuss with RN and will return  later this date if pt able to participate.   Rushie Chestnut 05/26/2020, 12:22 PM

## 2020-05-26 NOTE — Progress Notes (Signed)
Pt requesting bone foam removed. Educating on the importance of bone foam post knee surgery. Pt verbalized understanding but still requesting to be removed at this time.

## 2020-05-26 NOTE — TOC Transition Note (Signed)
Transition of Care [facility]) - CM/SW Discharge Note   Patient Details  Name: Stacey Henson MRN: [id number] Date of Birth: 01-26-1956  [facility] of [facility]) CM/SW Contact:  Maud Deed, LCSW Phone Number: 05/26/2020, 3:19 PM   Clinical Narrative:    CSW notified pt's daughter and Mellissa Kohut with [facility] of discharge.   Final next level of care: Home w Home Health Services Barriers to Discharge: No Barriers Identified   Patient Goals and CMS Choice Patient states their goals for this hospitalization and ongoing recovery are:: Get home with hhc for continued therapy      Discharge Placement                Patient to be transferred to facility by: Melanie Name of family member notified: Melanie Patient and family notified of of transfer: 05/26/20  Discharge Plan and Services                            HH Agency: [facility] ([facility]) Date HH Agency Contacted: 05/26/20 Time HH Agency Contacted: 1518 Representative spoke with at [facility] Agency: Mellissa Kohut  Social Determinants of Health (SDOH) Interventions     Readmission Risk Interventions No flowsheet data found.

## 2020-05-26 NOTE — Progress Notes (Signed)
   Subjective: 2 Days Post-Op Procedure(s) (LRB): LEFT TOTAL KNEE ARTHROPLASTY (Left) Patient reports pain as mild.   Patient is well, and has had no acute complaints or problems Denies any CP, SOB, ABD pain. We will continue therapy today.  Plan is to go Home after hospital stay.  Objective: Vital signs in last 24 hours: Temp:  [98.3 F (36.8 C)-98.7 F (37.1 C)] 98.7 F (37.1 C) (05/29 0746) Pulse Rate:  [93-103] 103 (05/29 0746) Resp:  [17-18] 17 (05/29 0746) BP: (121-141)/(73-99) 121/99 (05/29 0746) SpO2:  [96 %-100 %] 96 % (05/29 0746)  Intake/Output from previous day: 05/28 0701 - 05/29 0700 In: -  Out: 1100 [Urine:1100] Intake/Output this shift: No intake/output data recorded.  Recent Labs    05/24/20 1442 05/25/20 0602  HGB 12.8 11.3*   Recent Labs    05/24/20 1442 05/25/20 0602  WBC 9.7 12.4*  RBC 4.56 4.01  HCT 39.7 34.1*  PLT 237 220   Recent Labs    05/24/20 1442 05/25/20 0602  NA  --  141  K  --  4.5  CL  --  106  CO2  --  27  BUN  --  16  CREATININE 0.69 0.58  GLUCOSE  --  128*  CALCIUM  --  8.9   No results for input(s): LABPT, INR in the last 72 hours.  EXAM General - Patient is Alert, Appropriate and Oriented Extremity - Neurovascular intact Sensation intact distally Intact pulses distally Dorsiflexion/Plantar flexion intact No cellulitis present Compartment soft Dressing - dressing C/D/I and no drainage, prevena intact with out drainage Motor Function - intact, moving foot and toes well on exam.   Past Medical History:  Diagnosis Date  . Arthritis   . Bladder spasms   . Complication of anesthesia   . Headache    H/O MIGRAINES  . Hypothyroidism   . IC (interstitial cystitis)   . Lipidemia   . PONV (postoperative nausea and vomiting)    X 2  . Vertigo    DUE TO ALLERGIES    Assessment/Plan:   2 Days Post-Op Procedure(s) (LRB): LEFT TOTAL KNEE ARTHROPLASTY (Left) Active Problems:   Knee joint replacement status,  left  Estimated body mass index is 44 kg/m as calculated from the following:   Height as of this encounter: 5' (1.524 m).   Weight as of this encounter: 102.2 kg. Advance diet Up with therapy  Needs BM Pain well controlled VSS Labs stable CM To assist with discharge to home with HHPT today pending completion of PT goals  DVT Prophylaxis - Lovenox, Foot Pumps and TED hose Weight-Bearing as tolerated to left leg   T. Cranston Neighbor, PA-C [facility] 05/26/2020, 9:21 AM

## 2020-05-27 DIAGNOSIS — M1712 Unilateral primary osteoarthritis, left knee: Secondary | ICD-10-CM | POA: Diagnosis not present

## 2020-05-27 IMAGING — CR LEFT RIBS AND CHEST - 3+ VIEW
3 series · 3 of 3 positions shown · non-contrast
Comparison: None.

CLINICAL DATA: Shortness of breath and chest/rib pain. Fell and hit
left side of chest 3 days ago.

EXAM:
LEFT RIBS AND CHEST - 3+ VIEW

[chest pa]
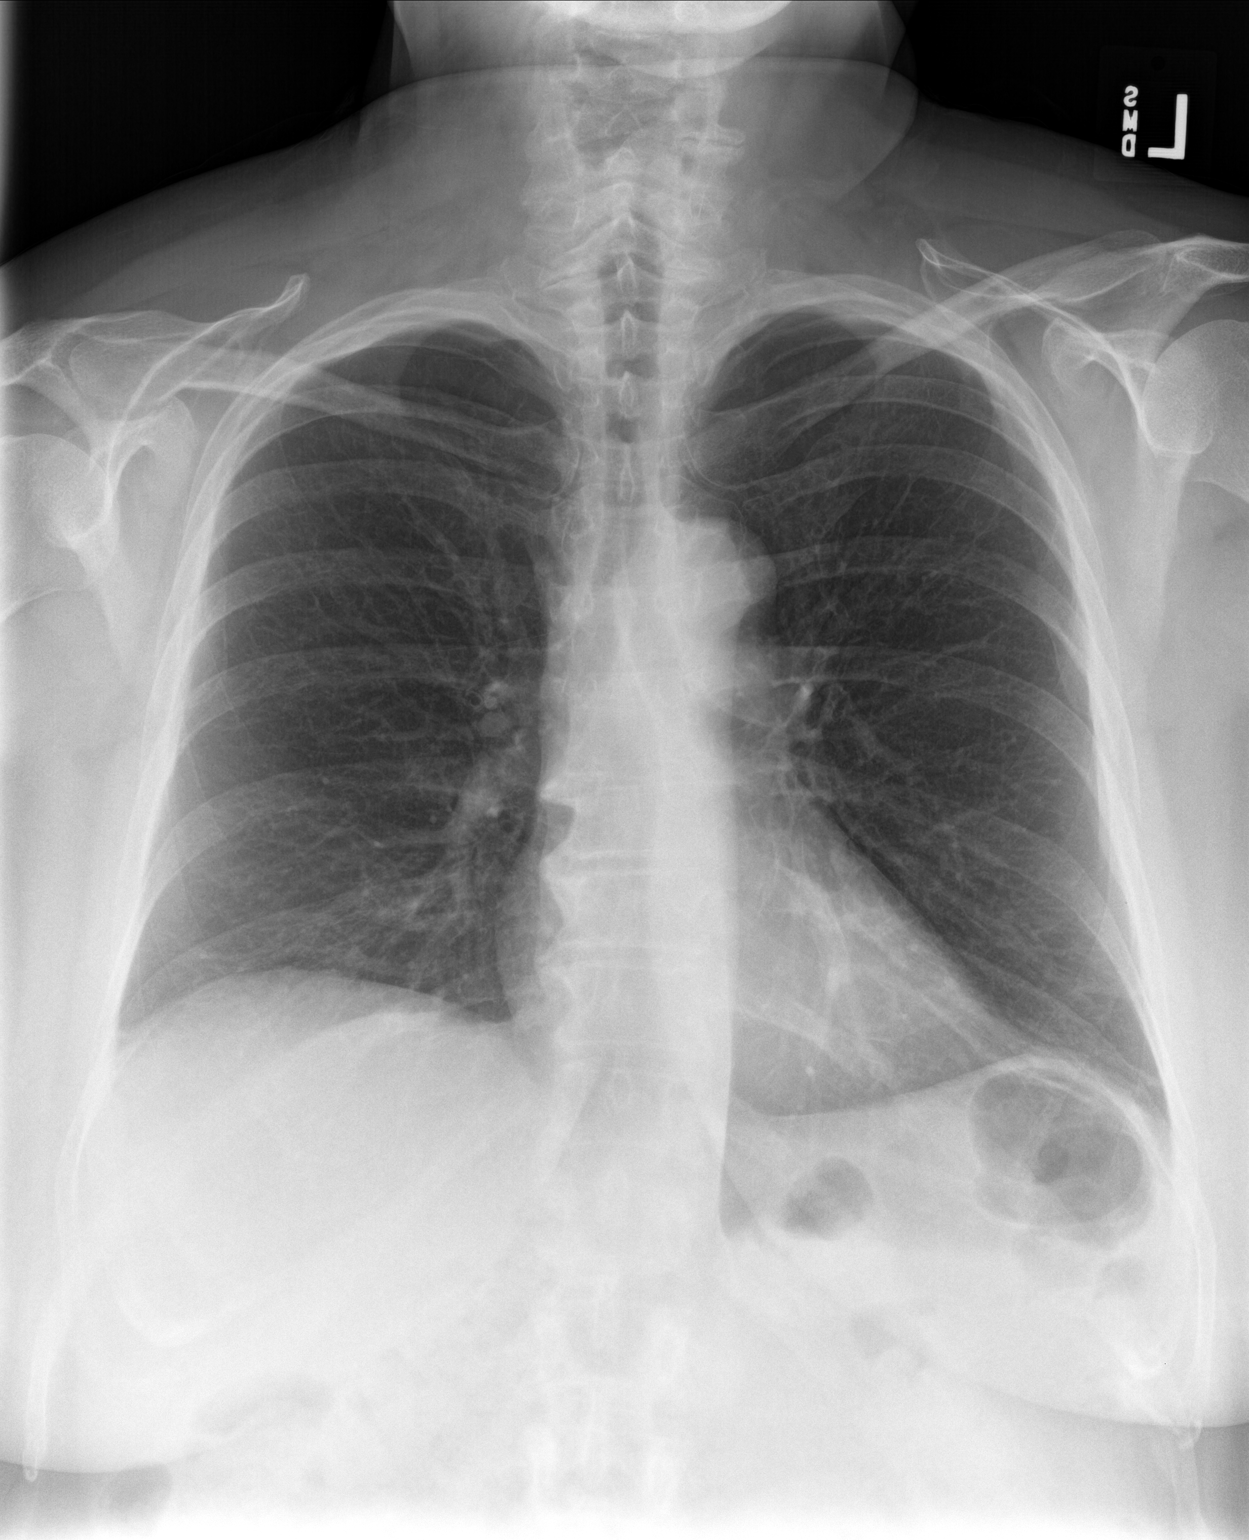

[rib pa]
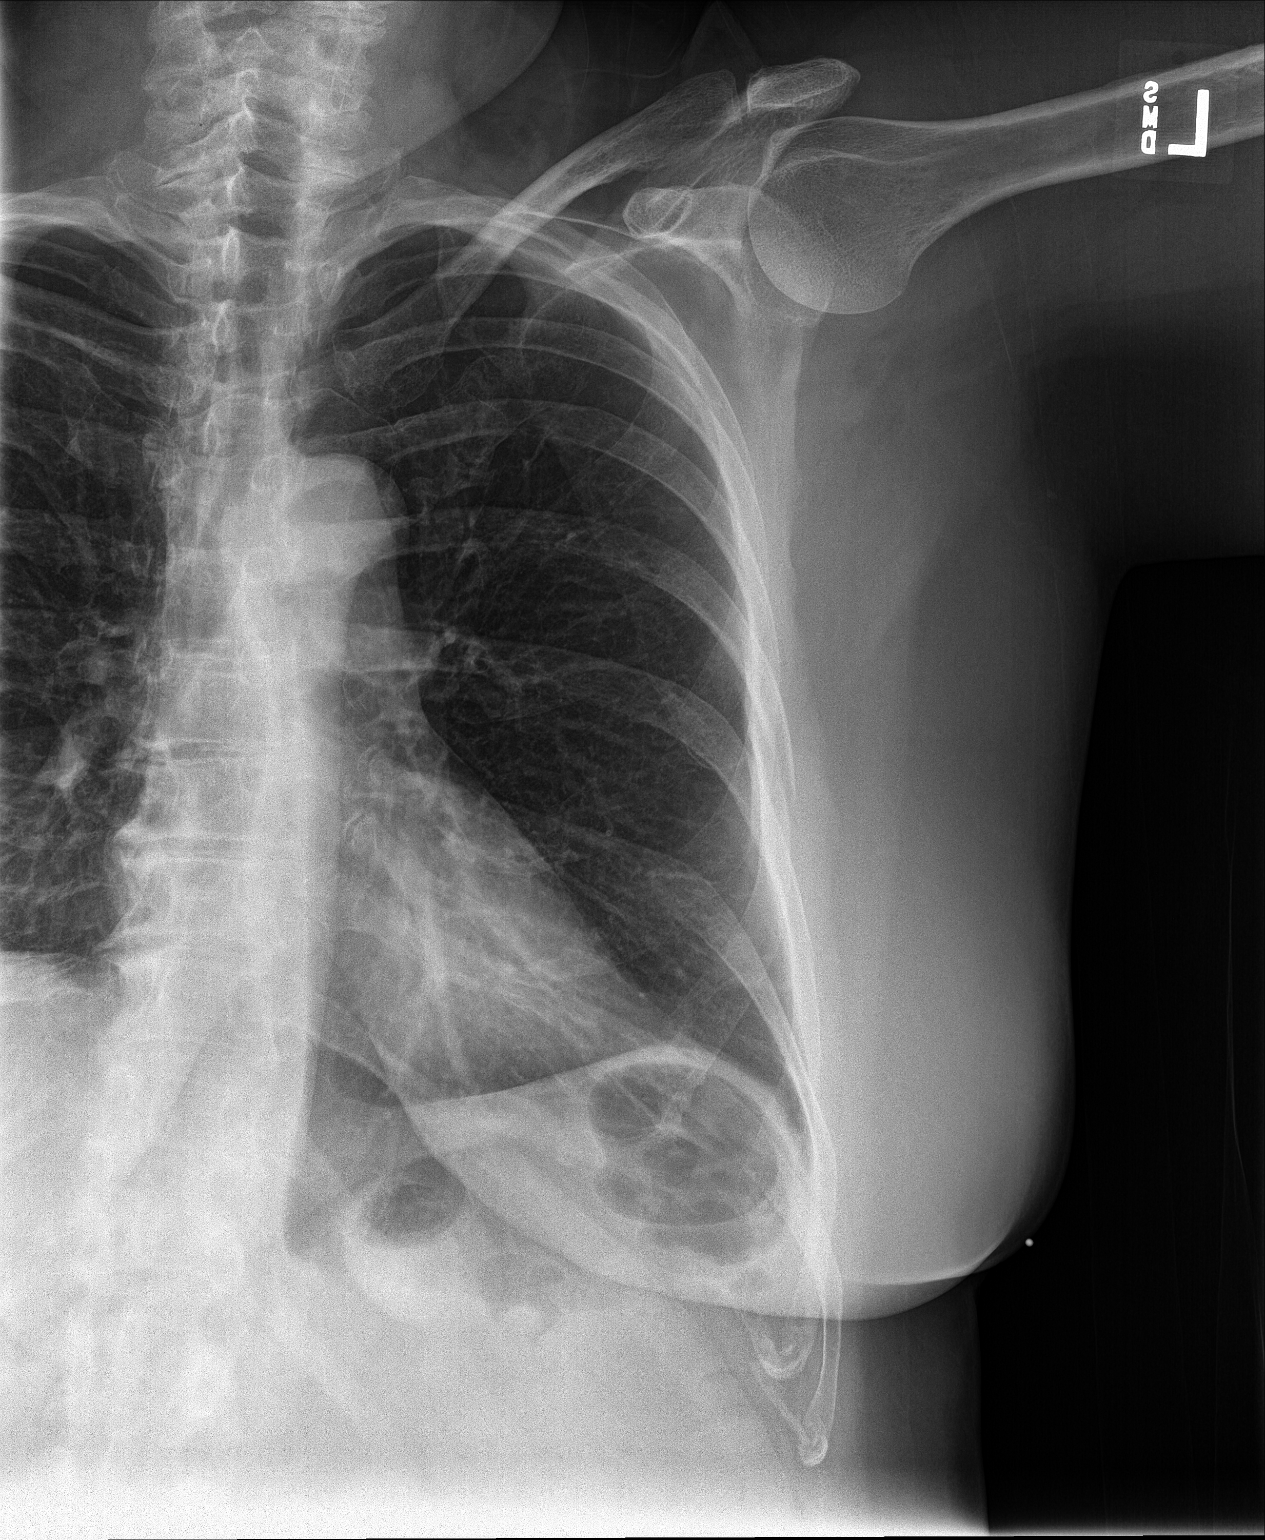

[rib obl]
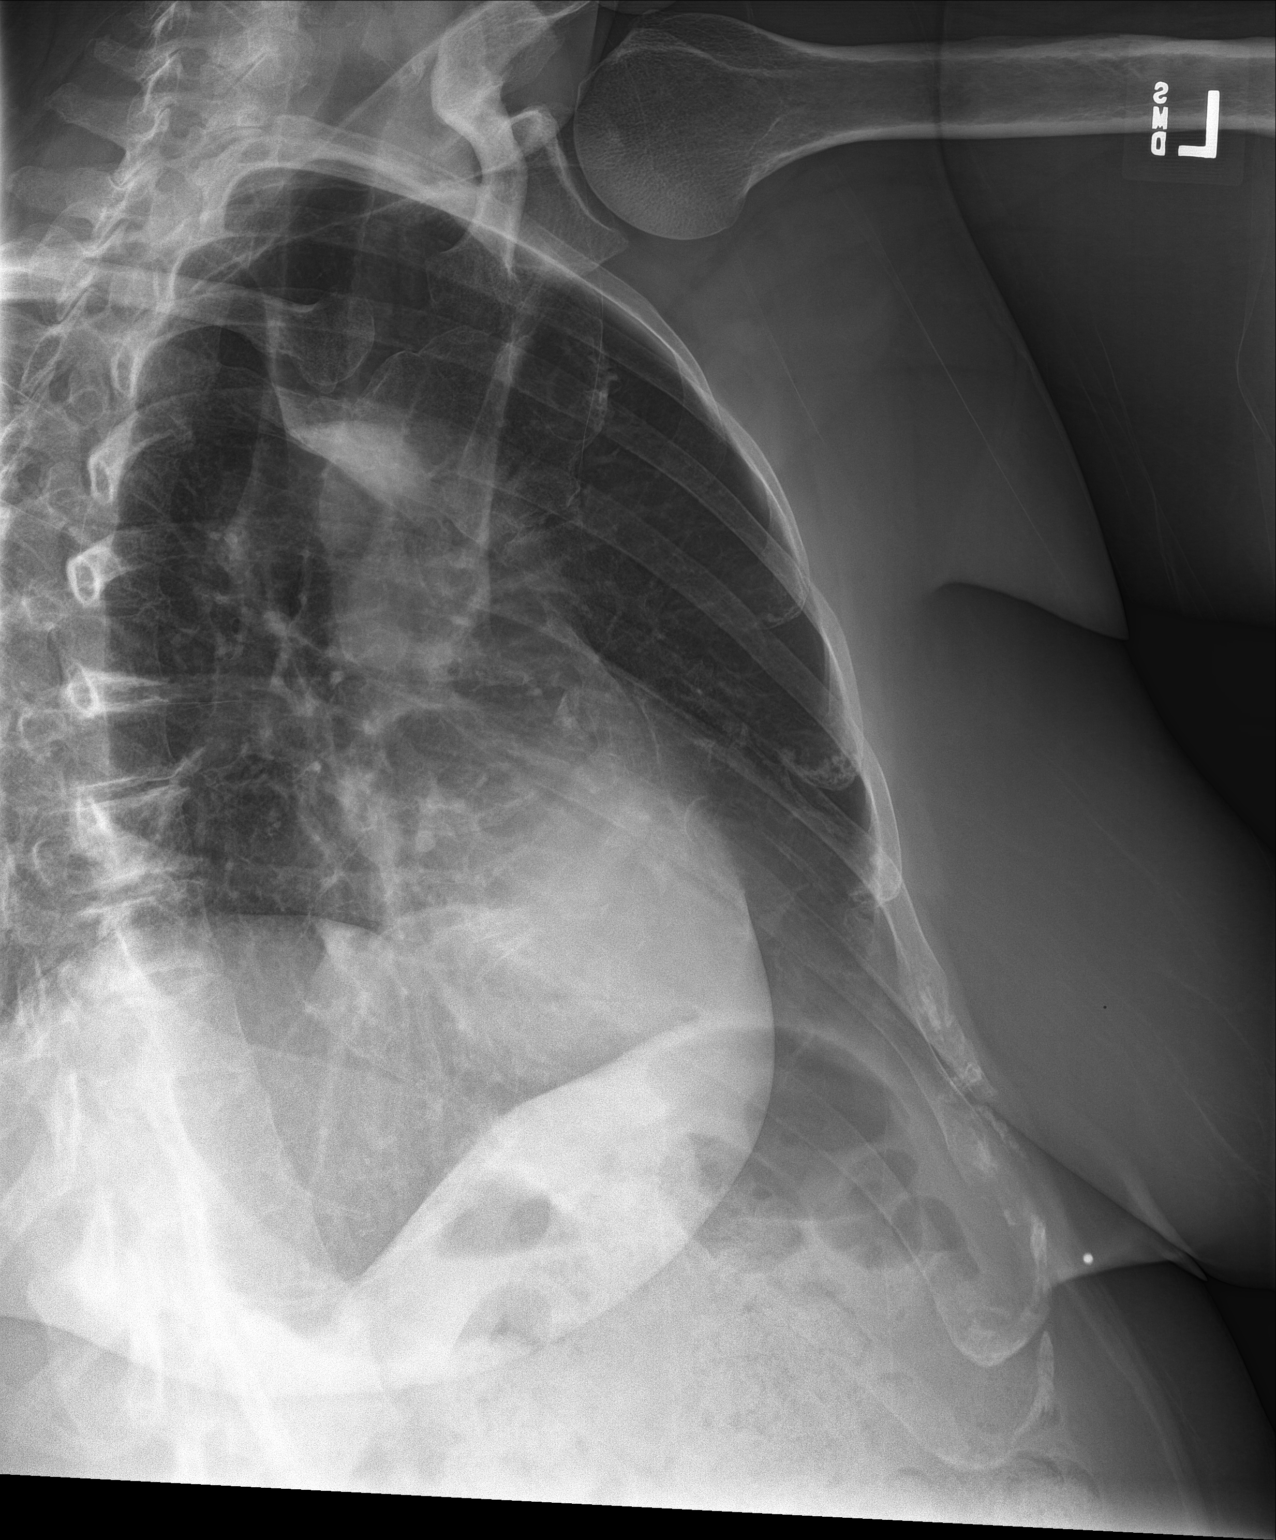

[3 of 3 positions shown; findings below may reference images not displayed]

FINDINGS: The cardiomediastinal silhouette is within normal limits. There is
minimal atelectasis or scarring in the lung bases. No pleural
effusion or pneumothorax is identified. There is a mildly displaced
fracture of the lateral left sixth rib.
IMPRESSION: Left sixth rib fracture.

## 2020-05-27 MED ORDER — MAGNESIUM HYDROXIDE 400 MG/5ML PO SUSP
30.0000 mL | Freq: Once | ORAL | Status: DC
  Filled 2020-05-27: qty 30

## 2020-05-27 MED ORDER — SODIUM CHLORIDE 0.9 % IV BOLUS
1000.0000 mL | Freq: Once | INTRAVENOUS | Status: AC
  Administered 2020-05-27: 1000 mL via INTRAVENOUS

## 2020-05-27 MED ORDER — HYDROCODONE-ACETAMINOPHEN 5-325 MG PO TABS: 1.0000 | ORAL_TABLET | ORAL | 0 refills | Status: DC | PRN

## 2020-05-27 MED ORDER — BISACODYL 10 MG RE SUPP
10.0000 mg | Freq: Once | RECTAL | Status: AC
  Administered 2020-05-27: 10 mg via RECTAL
  Filled 2020-05-27: qty 1

## 2020-05-27 NOTE — Progress Notes (Signed)
Discharged discontinued.  per physical therapy needed for another day

## 2020-05-27 NOTE — Progress Notes (Signed)
Physical Therapy Treatment Patient Details Name: Stacey Henson MRN: [id number] DOB: October 16, 1956 Today's Date: 05/27/2020    History of Present Illness Pt is a 64 yo female s/p L TKA, WBAT. PMH of headaches, hypothyroidism, interstitial cystitis, bladder surgery, lipidemia, arthritis    PT Comments    Pt motivated to return home today.  Pain more controlled today. Participated in exercises as described below.  To EOB with min a x 1 for LE management.  Stood and is able to walk 50' x 1 and 20' x 2 with RW and min guard with frequent verbal cues for safety and to wait for me to get ready for mobility.  She is taken to rehab gym for stair training.  +2 available to assist and was needed due to significant buckling LLE while stepping up with RLE to prevent fall.  She is unable to attempt a second step with 1 or 2 rails for support.  She is able to step back down with LLE but RLE remains up on the step and unable to WB on LLE enough to bring RLE comfortably and asks for wheelchair to be brought behind her and sits with RLE still up on stair.  Returned to room and to recliner with needs in reach.  HR remains elevated in 100'- 119 during session but generally stable.   Discussed with PA - Thayer Ohm. RN notified of difficulty with stairs this am.   Pt did not meet stair goals this am in order for a successful discharge home this am.  Will attempt again before lunch but I am not confident that she will be able to enter her home safely.  She will have her adult children to help but given 5 steps with only one reachable rail it may not be attainable today.  Will consider trying KI for support and confidence with stairs but I anticipate needing to delay DC and possibly looking at SNF given difficulty and continued limited gait distances.  She is safe for short household distances and voices she feels comfortable with mobility inside her home once in.  Will continue with PT treatments and discussions regarding  safest discharge plan.   Follow Up Recommendations  Home health PT;Supervision/Assistance - 24 hour     Equipment Recommendations  Rolling walker with 5" wheels;Other (comment)    Recommendations for Other Services       Precautions / Restrictions Precautions Precautions: Knee Precaution Booklet Issued: No Restrictions Weight Bearing Restrictions: Yes LLE Weight Bearing: Weight bearing as tolerated    Mobility  Bed Mobility Overal bed mobility: Needs Assistance Bed Mobility: Supine to Sit     Supine to sit: Min assist     General bed mobility comments: for LE management  Transfers Overall transfer level: Needs assistance Equipment used: Rolling walker (2 wheeled) Transfers: Sit to/from Stand Sit to Stand: Min guard;Supervision         General transfer comment: CGA for safety with vcs thorughout for technique  Ambulation/Gait Ambulation/Gait assistance: Min guard Gait Distance (Feet): 50 Feet Assistive device: Rolling walker (2 wheeled) Gait Pattern/deviations: Step-to pattern;Decreased step length - right;Decreased step length - left;Decreased stance time - left Gait velocity: decreased   General Gait Details: frequent stops and leans on walker to rest despite verbalizing "I know you don't want me to do this"   Stairs Stairs: Yes Stairs assistance: Min assist;Mod assist;+2 physical assistance;+2 safety/equipment Stair Management: One rail Left Number of Stairs: 1 General stair comments: L rail as at home.  completed 1 step with significant buckling LLE during attempts.  unable to complete second step or back down needing to sit in chair with LLE still up on first stair.   Wheelchair Mobility    Modified Rankin (Stroke Patients Only)       Balance Overall balance assessment: Needs assistance Sitting-balance support: Feet supported Sitting balance-Leahy Scale: Normal     Standing balance support: Bilateral upper extremity supported;During  functional activity Standing balance-Leahy Scale: Fair Standing balance comment: heavy use of RW for dynamic standing balance                            Cognition Arousal/Alertness: Awake/alert Behavior During Therapy: WFL for tasks assessed/performed Overall Cognitive Status: Within Functional Limits for tasks assessed                                 General Comments: Pt is A and O x 4      Exercises Total Joint Exercises Ankle Circles/Pumps: AROM;Strengthening;Both;15 reps Short Arc Quad: AAROM;Strengthening;Left;10 reps Heel Slides: AAROM;Strengthening;Left;10 reps Hip ABduction/ADduction: AROM;Strengthening;Left;10 reps Knee Flexion: AAROM;Strengthening;Left;15 reps Goniometric ROM: 0-82 limited by pain    General Comments        Pertinent Vitals/Pain Pain Assessment: Faces Faces Pain Scale: No hurt Pain Location: L knee - at rest with meds , does c/o inc pain and burning with activity but does not limit mobility today like yesterday Pain Descriptors / Indicators: Burning;Sore Pain Intervention(s): Limited activity within patient's tolerance;Monitored during session;Premedicated before session;Ice applied    Home Living                      Prior Function            PT Goals (current goals can now be found in the care plan section) Progress towards PT goals: Progressing toward goals    Frequency    BID      PT Plan Current plan remains appropriate;Other (comment)(If unable to complete stairs this pm may need to consider SNF)    Co-evaluation              AM-PAC PT "6 Clicks" Mobility   Outcome Measure  Help needed turning from your back to your side while in a flat bed without using bedrails?: A Little Help needed moving from lying on your back to sitting on the side of a flat bed without using bedrails?: A Little Help needed moving to and from a bed to a chair (including a wheelchair)?: A Little Help needed  standing up from a chair using your arms (e.g., wheelchair or bedside chair)?: A Little Help needed to walk in hospital room?: A Little Help needed climbing 3-5 steps with a railing? : Total 6 Click Score: 16    End of Session Equipment Utilized During Treatment: Gait belt Activity Tolerance: Patient limited by fatigue Patient left: in chair;with call bell/phone within reach;with chair alarm set Nurse Communication: Mobility status Pain - Right/Left: Left Pain - part of body: Knee     Time: 8242-3536 PT Time Calculation (min) (ACUTE ONLY): 38 min  Charges:  $Gait Training: 23-37 mins $Therapeutic Exercise: 8-22 mins                     Chesley Noon, PTA 05/27/20, 9:24 AM

## 2020-05-27 NOTE — [place] FL2 (Signed)
[place] MEDICAID FL2 LEVEL OF CARE SCREENING TOOL     IDENTIFICATION  Patient Name: Stacey Henson Birthdate: 05/28/1956 Sex: female Admission Date (Current Location): 05/24/2020  [place] and [place] Number:  Chiropodist and Address:  [facility], [address] [place], [place] [address]      Provider Number:    Attending Physician Name and Address:  Kennedy Bucker, MD  Relative Name and Phone Number:  Shawna [place] [phone]    Current Level of Care: Hospital Recommended Level of Care: Skilled Nursing Facility Prior Approval Number:    Date Approved/Denied:   PASRR Number: [id number]  Discharge Plan: SNF    Current Diagnoses: Patient Active Problem List   Diagnosis Date Noted  . Knee joint replacement status, left 05/24/2020    Orientation RESPIRATION BLADDER Height & Weight     Self, Time, Situation, Place  Normal Continent Weight: 225 lb 5 oz (102.2 kg) Height:  5' (152.4 cm)  BEHAVIORAL SYMPTOMS/MOOD NEUROLOGICAL BOWEL NUTRITION STATUS      Continent Diet(regular)  AMBULATORY STATUS COMMUNICATION OF NEEDS Skin   Limited Assist Verbally Surgical wounds                       Personal Care Assistance Level of Assistance  Bathing, Dressing, Feeding Bathing Assistance: Limited assistance Feeding assistance: Limited assistance Dressing Assistance: Limited assistance     Functional Limitations Info  Sight, Speech, Hearing Sight Info: Adequate Hearing Info: Adequate Speech Info: Adequate    SPECIAL CARE FACTORS FREQUENCY  PT (By licensed PT), OT (By licensed OT)     PT Frequency: 5x week OT Frequency: 5x week            Contractures Contractures Info: Not present    Additional Factors Info  Code Status, Allergies Code Status Info: Full Allergies Info: Morphine           Current Medications (05/27/2020):  This is the current hospital active medication list Current Facility-Administered  Medications  Medication Dose Route Frequency Provider Last Rate Last Admin  . 0.9 %  sodium chloride infusion   Intravenous Continuous Kennedy Bucker, MD      . acetaminophen (TYLENOL) tablet 325-650 mg  325-650 mg Oral Q6H PRN Kennedy Bucker, MD   650 mg at 05/26/20 0914  . alum & mag hydroxide-simeth (MAALOX/MYLANTA) 200-200-20 MG/5ML suspension 30 mL  30 mL Oral Q4H PRN Kennedy Bucker, MD      . amitriptyline (ELAVIL) tablet 50 mg  50 mg Oral QHS Kennedy Bucker, MD   50 mg at 05/26/20 2030  . atorvastatin (LIPITOR) tablet 40 mg  40 mg Oral q morning - 10a Kennedy Bucker, MD   40 mg at 05/27/20 1012  . bisacodyl (DULCOLAX) suppository 10 mg  10 mg Rectal Daily PRN Kennedy Bucker, MD      . cholecalciferol (VITAMIN D3) tablet 1,000 Units  1,000 Units Oral Daily Kennedy Bucker, MD   1,000 Units at 05/27/20 1012  . darifenacin (ENABLEX) 24 hr tablet 15 mg  15 mg Oral QHS Kennedy Bucker, MD   15 mg at 05/26/20 2031  . diphenhydrAMINE (BENADRYL) 12.5 MG/5ML elixir 12.5-25 mg  12.5-25 mg Oral Q4H PRN Kennedy Bucker, MD      . docusate sodium (COLACE) capsule 100 mg  100 mg Oral BID Kennedy Bucker, MD   100 mg at 05/27/20 1012  . enoxaparin (LOVENOX) injection 30 mg  30 mg Subcutaneous Q12H Kennedy Bucker, MD   30  mg at 05/27/20 0800  . HYDROcodone-acetaminophen (NORCO/VICODIN) 5-325 MG per tablet 1-2 tablet  1-2 tablet Oral Q4H PRN Hooten, Laurice Record, MD   1 tablet at 05/27/20 1023  . HYDROmorphone (DILAUDID) injection 0.5-1 mg  0.5-1 mg Intravenous Q4H PRN Hessie Knows, MD      . levothyroxine (SYNTHROID) tablet 50 mcg  50 mcg Oral Q0600 Hessie Knows, MD   50 mcg at 05/27/20 1937  . magnesium citrate solution 1 Bottle  1 Bottle Oral Once PRN Hessie Knows, MD      . magnesium hydroxide (MILK OF MAGNESIA) suspension 30 mL  30 mL Oral Daily PRN Hessie Knows, MD   30 mL at 05/26/20 0911  . magnesium hydroxide (MILK OF MAGNESIA) suspension 30 mL  30 mL Oral Once Duanne Guess, PA-C      . menthol-cetylpyridinium  (CEPACOL) lozenge 3 mg  1 lozenge Oral PRN Hessie Knows, MD       Or  . phenol (CHLORASEPTIC) mouth spray 1 spray  1 spray Mouth/Throat PRN Hessie Knows, MD      . methocarbamol (ROBAXIN) tablet 500 mg  500 mg Oral Q6H PRN Hessie Knows, MD   500 mg at 05/26/20 2030   Or  . methocarbamol (ROBAXIN) 500 mg in dextrose 5 % 50 mL IVPB  500 mg Intravenous Q6H PRN Hessie Knows, MD      . metoCLOPramide (REGLAN) tablet 5-10 mg  5-10 mg Oral Q8H PRN Hessie Knows, MD   10 mg at 05/26/20 2039   Or  . metoCLOPramide (REGLAN) injection 5-10 mg  5-10 mg Intravenous Q8H PRN Hessie Knows, MD      . multivitamin with minerals tablet 1 tablet  1 tablet Oral Daily Hessie Knows, MD   1 tablet at 05/27/20 1012  . ondansetron (ZOFRAN) tablet 4 mg  4 mg Oral Q6H PRN Hessie Knows, MD   4 mg at 05/27/20 9024   Or  . ondansetron [facility]) injection 4 mg  4 mg Intravenous Q6H PRN Hessie Knows, MD   4 mg at 05/26/20 1503  . pantoprazole (PROTONIX) EC tablet 40 mg  40 mg Oral Daily Hessie Knows, MD   40 mg at 05/27/20 1012  . traMADol (ULTRAM) tablet 50-100 mg  50-100 mg Oral Q6H PRN Duanne Guess, PA-C   100 mg at 05/26/20 1141  . zolpidem (AMBIEN) tablet 5 mg  5 mg Oral QHS PRN Hessie Knows, MD   5 mg at 05/26/20 2030     Discharge Medications: Please see discharge summary for a list of discharge medications.  Relevant Imaging Results:  Relevant Lab Results:   Additional Information [id number] [place], [place]

## 2020-05-27 NOTE — TOC Progression Note (Signed)
Transition of Care [facility]) - Progression Note    Patient Details  Name: Laraya Pestka MRN: [id number] Date of Birth: 04/28/1956  [facility]) CM/SW Contact  Maud Deed, LCSW Phone Number: 05/27/2020, 2:47 PM  Clinical Narrative:    CSW was notified by PT that pt is still having difficulty going up and down stairs and the PT will be changed her recommendation to SNF. CSW contacted pt to discuss new recommendations and pt was in agreement in order to build strength. CSW completed FL2 and PASRR and faxed out to surrounding facilities.   CSW notified Rosey Bath with [facility] of this change.  TOC will continue to follow for discharge planning needs.    Expected Discharge Plan: Skilled Nursing Facility Barriers to Discharge: No SNF bed, Continued Medical Work up  Expected Discharge Plan and Services Expected Discharge Plan: Skilled Nursing Facility     Post Acute Care Choice: Skilled Nursing Facility Living arrangements for the past 2 months: Single Family Home Expected Discharge Date: 05/26/20                           [facility] Agency: [facility]) Date HH Agency Contacted: 05/26/20 Time HH Agency Contacted: 1518 Representative spoke with at [facility] Agency: Mellissa Kohut   Social Determinants of Health (SDOH) Interventions    Readmission Risk Interventions No flowsheet data found.

## 2020-05-27 NOTE — Progress Notes (Addendum)
Physical Therapy Treatment Patient Details Name: Stacey Henson MRN: [id number] DOB: [birth year] Today's Date: 05/27/2020    History of Present Illness Pt is a 64 yo female s/p L TKA, WBAT. PMH of headaches, hypothyroidism, interstitial cystitis, bladder surgery, lipidemia, arthritis    PT Comments    Pt ready to try stairs again this pm.  Discussed options and she wanted to try KI for support.  Obtained KI and donned.  She is able to stand at stairs but even with KI she is unable to pick RLE up to place on step.  She is hesitant to put all her weight on LLE.  Discussed options in and out of home and in the end she realized that she is not ready to go home at this time and risks falling or being unable to get into the home if she tries.  She is able to walk 50' on unit with RW and min guard self limited by fatigue.    Given continued difficulty with stairs despite KI, discharge recommendations are updated to SNF at this time.  She is open to SNF if needed.  Will continue with current goals in hopes she will progress in her abilities and be able to discharge home but given her difficulty with steps and continued limited gait on unit SNF is appropriate at this time.  Discussed with RN, PA, SWS and PT on site.   Follow Up Recommendations  SNF;Other (comment)     Equipment Recommendations  Rolling walker with 5" wheels;Other (comment)(pediatric)    Recommendations for Other Services       Precautions / Restrictions Precautions Precautions: Knee Precaution Booklet Issued: No Required Braces or Orthoses: Knee Immobilizer - Left Restrictions Weight Bearing Restrictions: Yes LLE Weight Bearing: Weight bearing as tolerated Other Position/Activity Restrictions: KI used for stairs but unable.  Would benefit from using it again tomorrow in trials.    Mobility  Bed Mobility Overal bed mobility: Needs Assistance Bed Mobility: Supine to Sit     Supine to sit: Min assist      General bed mobility comments: remained in chair before and after  Transfers Overall transfer level: Needs assistance Equipment used: Rolling walker (2 wheeled) Transfers: Sit to/from Stand Sit to Stand: Min guard;Supervision         General transfer comment: CGA for safety with vcs thorughout for technique  Ambulation/Gait Ambulation/Gait assistance: Min guard Gait Distance (Feet): 50 Feet Assistive device: Rolling walker (2 wheeled) Gait Pattern/deviations: Step-to pattern;Decreased step length - right;Decreased step length - left;Decreased stance time - left Gait velocity: decreased   General Gait Details: self limits gait due to fatigue.  continues to need some vc's for safety   Stairs Stairs: Yes Stairs assistance: Mod assist;+2 physical assistance Stair Management: One rail Left Number of Stairs: 0 General stair comments: unable to step despite assist and KI donned for support   Wheelchair Mobility    Modified Rankin (Stroke Patients Only)       Balance Overall balance assessment: Needs assistance Sitting-balance support: Feet supported Sitting balance-Leahy Scale: Normal     Standing balance support: Bilateral upper extremity supported;During functional activity Standing balance-Leahy Scale: Fair Standing balance comment: heavy use of RW for dynamic standing balance                            Cognition Arousal/Alertness: Awake/alert Behavior During Therapy: WFL for tasks assessed/performed Overall Cognitive Status: Within Functional Limits for tasks assessed  General Comments: Pt is A and O x 4      Exercises Total Joint Exercises Ankle Circles/Pumps: AROM;Strengthening;Both;15 reps Short Arc Quad: AAROM;Strengthening;Left;10 reps Heel Slides: AAROM;Strengthening;Left;10 reps Hip ABduction/ADduction: AROM;Strengthening;Left;10 reps Knee Flexion: AAROM;Strengthening;Left;15  reps Goniometric ROM: 0-82 limited by pain    General Comments        Pertinent Vitals/Pain Pain Assessment: Faces Faces Pain Scale: Hurts a little bit Pain Location: L knee - at rest with meds , does c/o inc pain and burning with activity but does not limit mobility today like yesterday Pain Descriptors / Indicators: Burning;Sore Pain Intervention(s): Limited activity within patient's tolerance;Monitored during session    Home Living                      Prior Function            PT Goals (current goals can now be found in the care plan section) Progress towards PT goals: Progressing toward goals    Frequency    BID      PT Plan Discharge plan needs to be updated    Co-evaluation              AM-PAC PT "6 Clicks" Mobility   Outcome Measure  Help needed turning from your back to your side while in a flat bed without using bedrails?: A Little Help needed moving from lying on your back to sitting on the side of a flat bed without using bedrails?: A Little Help needed moving to and from a bed to a chair (including a wheelchair)?: A Little Help needed standing up from a chair using your arms (e.g., wheelchair or bedside chair)?: A Little Help needed to walk in hospital room?: A Little Help needed climbing 3-5 steps with a railing? : Total 6 Click Score: 16    End of Session Equipment Utilized During Treatment: Gait belt Activity Tolerance: Patient limited by fatigue Patient left: in chair;with call bell/phone within reach;with chair alarm set Nurse Communication: Mobility status Pain - Right/Left: Left Pain - part of body: Knee     Time: 1610-9604 PT Time Calculation (min) (ACUTE ONLY): 34 min  Charges:  $Gait Training: 23-37 mins $Therapeutic Exercise: 8-22 mins                    Danielle Dess, PTA 05/27/20, 12:49 PM

## 2020-05-27 NOTE — Progress Notes (Signed)
   Subjective: 3 Days Post-Op Procedure(s) (LRB): LEFT TOTAL KNEE ARTHROPLASTY (Left) Patient reports pain as mild.  Improved from yesterday Patient is well, and has had no acute complaints or problems  HR 110, no chest pain or SOB Denies any CP, SOB, ABD pain. We will continue therapy today.  Plan is to go Home after hospital stay.  Objective: Vital signs in last 24 hours: Temp:  [98.3 F (36.8 C)-98.7 F (37.1 C)] 98.7 F (37.1 C) (05/29 2227) Pulse Rate:  [93-110] 110 (05/29 2227) Resp:  [16] 16 (05/29 2227) BP: (134-141)/(44-52) 141/44 (05/29 2227) SpO2:  [95 %-98 %] 95 % (05/29 2227)  Intake/Output from previous day: 05/29 0701 - 05/30 0700 In: -  Out: 700 [Urine:700] Intake/Output this shift: No intake/output data recorded.  Recent Labs    05/24/20 1442 05/25/20 0602  HGB 12.8 11.3*   Recent Labs    05/24/20 1442 05/25/20 0602  WBC 9.7 12.4*  RBC 4.56 4.01  HCT 39.7 34.1*  PLT 237 220   Recent Labs    05/24/20 1442 05/25/20 0602  NA  --  141  K  --  4.5  CL  --  106  CO2  --  27  BUN  --  16  CREATININE 0.69 0.58  GLUCOSE  --  128*  CALCIUM  --  8.9   No results for input(s): LABPT, INR in the last 72 hours.  EXAM General - Patient is Alert, Appropriate and Oriented  Heart - regular rhythm, tachy 110 Extremity - Neurovascular intact Sensation intact distally Intact pulses distally Dorsiflexion/Plantar flexion intact No cellulitis present Compartment soft Dressing - dressing C/D/I and no drainage, prevena intact with out drainage Motor Function - intact, moving foot and toes well on exam.   Past Medical History:  Diagnosis Date  . Arthritis   . Bladder spasms   . Complication of anesthesia   . Headache    H/O MIGRAINES  . Hypothyroidism   . IC (interstitial cystitis)   . Lipidemia   . PONV (postoperative nausea and vomiting)    X 2  . Vertigo    DUE TO ALLERGIES    Assessment/Plan:   3 Days Post-Op Procedure(s) (LRB): LEFT  TOTAL KNEE ARTHROPLASTY (Left) Active Problems:   Knee joint replacement status, left  Estimated body mass index is 44 kg/m as calculated from the following:   Height as of this encounter: 5' (1.524 m).   Weight as of this encounter: 102.2 kg. Advance diet Up with therapy  Needs BM Pain well controlled Afebrile, BP Normal 1 liter of NS today, recheck HR, continue to monitor. Patient asymptomatic Possible discharge home with HHPT today  DVT Prophylaxis - Lovenox, Foot Pumps and TED hose Weight-Bearing as tolerated to left leg   T. Cranston Neighbor, PA-C [facility] 05/27/2020, 8:47 AM

## 2020-05-28 DIAGNOSIS — M1712 Unilateral primary osteoarthritis, left knee: Secondary | ICD-10-CM | POA: Diagnosis not present

## 2020-05-28 NOTE — TOC Progression Note (Signed)
Transition of Care [facility]) - Progression Note    Patient Details  Name: Stacey Henson MRN: [id number] Date of Birth: 31-May-1956  Transition of Care [facility]) CM/SW Contact  Barrie Dunker, RN Phone Number: 05/28/2020, 11:23 AM  Clinical Narrative:    Spoke with the patient and reviewed bed offer, she choose [facility], Unable to get insurance approval due to [facility] is closed, I notified the physician   Expected Discharge Plan: Skilled Nursing Facility Barriers to Discharge: No SNF bed, Continued Medical Work up  Expected Discharge Plan and Services Expected Discharge Plan: Skilled Nursing Facility     Post Acute Care Choice: Skilled Nursing Facility Living arrangements for the past 2 months: Single Family Home Expected Discharge Date: 05/28/20                           [facility] Agency: [facility]) Date HH Agency Contacted: 05/26/20 Time HH Agency Contacted: 1518 Representative spoke with at [facility] Agency: Mellissa Kohut   Social Determinants of Health (SDOH) Interventions    Readmission Risk Interventions No flowsheet data found.

## 2020-05-28 NOTE — Progress Notes (Signed)
ORTHOPAEDICS PROGRESS NOTE  PATIENT NAME: Stacey Henson DOB: 05-May-1956  MRN: [id number]  POD # 4: Left total knee arthroplasty  Subjective: The patient is in good spirits this morning.  She states that she feels better.  Pain is under better control.  Objective: Vital signs in last 24 hours: Temp:  [97.9 F (36.6 C)-98.7 F (37.1 C)] 98.7 F (37.1 C) (05/31 0007) Pulse Rate:  [97-107] 107 (05/31 0007) Resp:  [16-18] 16 (05/31 0007) BP: (110-136)/(51-68) 110/51 (05/31 0007) SpO2:  [95 %-100 %] 95 % (05/31 0007)  Intake/Output from previous day: 05/30 0701 - 05/31 0700 In: 1123.7 [P.O.:120; IV Piggyback:1003.7] Out: 1350 [Urine:1350]  No results for input(s): WBC, HGB, HCT, PLT, K, CL, CO2, BUN, CREATININE, GLUCOSE, CALCIUM, LABPT, INR in the last 72 hours.  EXAM General: Well-developed well-nourished female seen in no apparent discomfort. Left lower extremity: Dressing is clean and dry.  Homans test is negative.  The patient is able to perform a straight leg raise with minimal assistance.  Fair quadriceps firing.  Homans test is negative. Neurologic: Awake, alert, and oriented.  Sensory and motor function are intact.  Assessment: Left total knee arthroplasty  Secondary diagnoses: Hypothyroidism Vertigo Lipidemia Interstitial cystitis  Plan: Notes from physical therapy were reviewed.  The patient was unable to safely navigate stairs yesterday.  She feels stronger today and we will see how she progresses with therapy. DVT Prophylaxis - Lovenox, Foot Pumps and TED hose  Rylei Masella P. Angie Fava M.D.

## 2020-05-28 NOTE — Progress Notes (Signed)
Physical Therapy Treatment Patient Details Name: Stacey Henson MRN: [id number] DOB: 10-30-1956 Today's Date: 05/28/2020    History of Present Illness Pt is a 64 yo female s/p L TKA, WBAT. PMH of headaches, hypothyroidism, interstitial cystitis, bladder surgery, lipidemia, arthritis    PT Comments    Pt pleasant and motivated to participate during the session.  Pt required min A with bed mobility tasks for LLE management and was able to amb 2 x 10' with a RW with antalgic, step-to gait pattern.  Pt able to ascend and descend 1 step x 2 with B rails with +2 min A for support with pt c/o L knee buckling even with KI donned; pt able to ascend/descend 1 step with 1 rail with extreme effort and +2 mod A for stability with L knee buckling during SLS phase.  Pt would not be safe to attempt home entry/exit at this time and will benefit from PT services in a SNF setting upon discharge to safely address deficits listed in patient problem list for decreased caregiver assistance and eventual return to PLOF.     Follow Up Recommendations  SNF     Equipment Recommendations  Rolling walker with 5" wheels;Other (comment)(Pediatric)    Recommendations for Other Services       Precautions / Restrictions Precautions Precautions: Knee Precaution Comments: HEP handout Required Braces or Orthoses: Knee Immobilizer - Left Knee Immobilizer - Left: Other (comment)(Used KI for safety with step training) Restrictions Weight Bearing Restrictions: Yes LLE Weight Bearing: Weight bearing as tolerated Other Position/Activity Restrictions: KI used during stair training    Mobility  Bed Mobility Overal bed mobility: Needs Assistance       Supine to sit: Min assist     General bed mobility comments: Min A for LLE control during sup to sit  Transfers Overall transfer level: Needs assistance Equipment used: Rolling walker (2 wheeled) Transfers: Sit to/from Stand Sit to Stand: Min guard          General transfer comment: Min verbal cues for proper sequencing wtih transfers  Ambulation/Gait Ambulation/Gait assistance: Min guard Gait Distance (Feet): 10 Feet x 2 Assistive device: Rolling walker (2 wheeled) Gait Pattern/deviations: Step-to pattern;Antalgic;Decreased stance time - left Gait velocity: decreased   General Gait Details: Amb limited by pt to save energy for stair training   Stairs Stairs: Yes Stairs assistance: +2 physical assistance;Min assist;Mod assist Stair Management: Two rails Number of Stairs: 2 General stair comments: Pt able to ascend and descend 1 step x 2 with B rails and +2 min A for support with pt c/o L knee buckling even with KI donned; pt able to ascend/descend 1 step with 1 rail with extreme effort and +2 mod A for stability with L knee buckling   Wheelchair Mobility    Modified Rankin (Stroke Patients Only)       Balance Overall balance assessment: Needs assistance Sitting-balance support: Feet supported Sitting balance-Leahy Scale: Normal     Standing balance support: Bilateral upper extremity supported;During functional activity Standing balance-Leahy Scale: Fair Standing balance comment: heavy use of RW for dynamic standing balance                            Cognition Arousal/Alertness: Awake/alert Behavior During Therapy: WFL for tasks assessed/performed Overall Cognitive Status: Within Functional Limits for tasks assessed  Exercises Total Joint Exercises Ankle Circles/Pumps: AROM;Strengthening;Both;15 reps;10 reps Quad Sets: AROM;Strengthening;Left;15 reps;10 reps Gluteal Sets: Strengthening;Both;10 reps Long Arc Quad: AROM;Strengthening;10 reps;15 reps;Left Knee Flexion: AROM;Strengthening;10 reps;15 reps;Left Goniometric ROM: L knee AROM: 0-78 deg limited by pain Other Exercises Other Exercises: HEP education for L knee AROM with QS and seated knee  flex Other Exercises: Pt education on LLE positioning when in bed/recliner    General Comments        Pertinent Vitals/Pain Pain Assessment: 0-10 Pain Score: 5  Pain Location: L knee Pain Descriptors / Indicators: Aching;Sore Pain Intervention(s): Premedicated before session;Monitored during session    Home Living                      Prior Function            PT Goals (current goals can now be found in the care plan section) Progress towards PT goals: Progressing toward goals    Frequency    BID      PT Plan Discharge plan needs to be updated    Co-evaluation              AM-PAC PT "6 Clicks" Mobility   Outcome Measure  Help needed turning from your back to your side while in a flat bed without using bedrails?: A Little Help needed moving from lying on your back to sitting on the side of a flat bed without using bedrails?: A Little Help needed moving to and from a bed to a chair (including a wheelchair)?: A Little Help needed standing up from a chair using your arms (e.g., wheelchair or bedside chair)?: A Little Help needed to walk in hospital room?: A Little Help needed climbing 3-5 steps with a railing? : Total 6 Click Score: 16    End of Session Equipment Utilized During Treatment: Gait belt Activity Tolerance: Patient tolerated treatment well Patient left: in chair;with call bell/phone within reach;with chair alarm set;with SCD's reapplied;Other (comment)(Polar care donned to L knee) Nurse Communication: Mobility status PT Visit Diagnosis: Difficulty in walking, not elsewhere classified (R26.2);Muscle weakness (generalized) (M62.81);Pain;Other abnormalities of gait and mobility (R26.89) Pain - Right/Left: Left Pain - part of body: Knee     Time: 0981-1914 PT Time Calculation (min) (ACUTE ONLY): 43 min  Charges:  $Gait Training: 23-37 mins $Therapeutic Exercise: 8-22 mins                     D. Scott Naveh Rickles PT, DPT 05/28/20, 11:16  AM

## 2020-05-29 DIAGNOSIS — M1712 Unilateral primary osteoarthritis, left knee: Secondary | ICD-10-CM | POA: Diagnosis not present

## 2020-05-29 LAB — SARS CORONAVIRUS 2 BY RT PCR (HOSPITAL ORDER, PERFORMED IN ~~LOC~~ HOSPITAL LAB): SARS Coronavirus 2: NEGATIVE

## 2020-05-29 NOTE — TOC Progression Note (Signed)
Transition of Care [facility]) - Progression Note    Patient Details  Name: Stacey Henson MRN: [id number] Date of Birth: 1956/03/24  Transition of Care [facility]) CM/SW Contact  Barrie Dunker, RN Phone Number: 05/29/2020, 10:24 AM  Clinical Narrative:       Expected Discharge Plan: Skilled Nursing Facility Barriers to Discharge: No SNF bed, Continued Medical Work up  Expected Discharge Plan and Services Expected Discharge Plan: Skilled Nursing Facility     Post Acute Care Choice: Skilled Nursing Facility Living arrangements for the past 2 months: Single Family Home Expected Discharge Date: 05/28/20                           [facility] Agency: [facility] Date HH Agency Contacted: 05/26/20 Time HH Agency Contacted: [phone] Representative spoke with at [facility] Agency: Mellissa Kohut   Social Determinants of Health (SDOH) Interventions    Readmission Risk Interventions No flowsheet data found.

## 2020-05-29 NOTE — TOC Progression Note (Signed)
Transition of Care [facility]) - Progression Note    Patient Details  Name: Stacey Henson MRN: [id number] Date of Birth: 03-Jul-1956  Transition of Care [facility]) CM/SW Contact  Barrie Dunker, RN Phone Number: 05/29/2020, 10:38 AM  Clinical Narrative:     Received notification from [facility] notifying me that they received Auth approval and the patient may come today I notified the physician   Expected Discharge Plan: Skilled Nursing Facility Barriers to Discharge: No SNF bed, Continued Medical Work up  Expected Discharge Plan and Services Expected Discharge Plan: Skilled Nursing Facility     Post Acute Care Choice: Skilled Nursing Facility Living arrangements for the past 2 months: Single Family Home Expected Discharge Date: 05/28/20                           [facility] Agency: [facility]) Date HH Agency Contacted: 05/26/20 Time HH Agency Contacted: [phone] Representative spoke with at [facility] Agency: Mellissa Kohut   Social Determinants of Health (SDOH) Interventions    Readmission Risk Interventions No flowsheet data found.

## 2020-05-29 NOTE — TOC Transition Note (Signed)
Transition of Care [facility]) - CM/SW Discharge Note   Patient Details  Name: Sheyanne Munley MRN: [id number] Date of Birth: Mar 31, 1956  Transition of Care [facility]) CM/SW Contact:  Barrie Dunker, RN Phone Number: 05/29/2020, 3:24 PM   Clinical Narrative:     Patient to DC to [facility] today The bed bedside nurse is calling report RNCM called EMS to [facility] and the patient is the first on the list for transport,   Final next level of care: Skilled Nursing Facility Barriers to Discharge: Barriers Resolved   Patient Goals and CMS Choice Patient states their goals for this hospitalization and ongoing recovery are:: Does not want to go to SNF but thinks it's the best option at this time CMS Medicare.gov Compare Post Acute Care list provided to:: Patient Choice offered to / list presented to : Patient  Discharge Placement              Patient chooses bed at: [facility] Patient to be transferred to facility by: [facility] Name of family member notified: Patient will call family herself she stated Patient and family notified of of transfer: 05/29/20  Discharge Plan and Services     Post Acute Care Choice: Skilled Nursing Facility                      [facility] Agency: [facility] Date [facility] Agency Contacted: 05/26/20 Time HH Agency Contacted: [phone] Representative spoke with at [facility] Agency: Mellissa Kohut  Social Determinants of Health (SDOH) Interventions     Readmission Risk Interventions No flowsheet data found.

## 2020-05-29 NOTE — Progress Notes (Signed)
Physical Therapy Treatment Patient Details Name: Stacey Henson MRN: [id number] DOB: Dec 11, 1956 Today's Date: 05/29/2020    History of Present Illness Pt is a 64 yo female s/p L TKA, WBAT. PMH of headaches, hypothyroidism, interstitial cystitis, bladder surgery, lipidemia, arthritis    PT Comments    Pt was long sitting in bed eating breakfast upon arrivng. She agrees to PT session and is cooperative and pleasant throughout. Pt reports feeling hopeful about DC to SNF today. She was able to exit R side of bed with supervision + increased time and use of bedrails (HOB elevated). She stood with CGA and ambulated 80 ft with slow step to antalgic gait pattern with several standing rest. She was too fatigued to want to trial stairs this date. Once returned to room and seated in recliner, pt performed several ROM exercises prior to demonstrating AROM L knee 2-77 degrees. Reviewed all exercises with pt and she states understanding and correct performance.Pt was seated in recliner with call bell in reach, polar care reapplied, towel roll under L heel, and RN aware of pt's abilities. Recommend DC to SNF to address ROM, strength, and safe functional mobility deficits.     Follow Up Recommendations  SNF     Equipment Recommendations  Rolling walker with 5" wheels;Other (comment)    Recommendations for Other Services       Precautions / Restrictions Precautions Precautions: Knee Precaution Booklet Issued: Yes (comment) Precaution Comments: HEP handout Required Braces or Orthoses: Knee Immobilizer - Left Restrictions Weight Bearing Restrictions: Yes LLE Weight Bearing: Weight bearing as tolerated    Mobility  Bed Mobility Overal bed mobility: Modified Independent;Needs Assistance Bed Mobility: Supine to Sit     Supine to sit: Supervision;HOB elevated     General bed mobility comments: Pt was able to exit R side of bed with increased time. HOB elevated and pt did use  bedrails  Transfers Overall transfer level: Needs assistance Equipment used: Rolling walker (2 wheeled) Transfers: Sit to/from Stand Sit to Stand: Min guard         General transfer comment: CGA for safety to STS from slightly elevated bed height  Ambulation/Gait Ambulation/Gait assistance: Min guard Gait Distance (Feet): 80 Feet Assistive device: Rolling walker (2 wheeled) Gait Pattern/deviations: Step-to pattern;Antalgic;Decreased stance time - left Gait velocity: decreased   General Gait Details: pt was able to ambulate 80 bu required several standing rest. pt tends to lean on forearms once fatigued even when cued not to. She was too fatigued with gait training to trial stairs.   Stairs             Wheelchair Mobility    Modified Rankin (Stroke Patients Only)       Balance Overall balance assessment: Needs assistance Sitting-balance support: Feet supported Sitting balance-Leahy Scale: Normal     Standing balance support: Bilateral upper extremity supported;During functional activity Standing balance-Leahy Scale: Fair Standing balance comment: heavy use of RW for dynamic standing balance                            Cognition Arousal/Alertness: Awake/alert Behavior During Therapy: WFL for tasks assessed/performed Overall Cognitive Status: Within Functional Limits for tasks assessed                                 General Comments: Pt is A and O x 4  Exercises Total Joint Exercises Knee Flexion: AROM;10 reps;Seated Goniometric ROM: 2-77    General Comments        Pertinent Vitals/Pain Pain Assessment: 0-10 Pain Score: 4  Faces Pain Scale: Hurts a little bit Pain Location: L knee Pain Descriptors / Indicators: Aching;Sore Pain Intervention(s): Limited activity within patient's tolerance;Monitored during session;Premedicated before session;Repositioned;Ice applied    Home Living                      Prior  Function            PT Goals (current goals can now be found in the care plan section) Acute Rehab PT Goals Patient Stated Goal: to go home safely Progress towards PT goals: Progressing toward goals    Frequency    BID      PT Plan Current plan remains appropriate    Co-evaluation              AM-PAC PT "6 Clicks" Mobility   Outcome Measure  Help needed turning from your back to your side while in a flat bed without using bedrails?: A Little Help needed moving from lying on your back to sitting on the side of a flat bed without using bedrails?: A Little Help needed moving to and from a bed to a chair (including a wheelchair)?: A Little Help needed standing up from a chair using your arms (e.g., wheelchair or bedside chair)?: A Little Help needed to walk in hospital room?: A Little Help needed climbing 3-5 steps with a railing? : Total 6 Click Score: 16    End of Session Equipment Utilized During Treatment: Gait belt Activity Tolerance: Patient tolerated treatment well Patient left: in chair;with call bell/phone within reach;with chair alarm set;with SCD's reapplied;Other (comment) Nurse Communication: Mobility status PT Visit Diagnosis: Difficulty in walking, not elsewhere classified (R26.2);Muscle weakness (generalized) (M62.81);Pain;Other abnormalities of gait and mobility (R26.89) Pain - Right/Left: Left Pain - part of body: Knee     Time: 7026-3785 PT Time Calculation (min) (ACUTE ONLY): 23 min  Charges:  $Gait Training: 8-22 mins $Therapeutic Exercise: 8-22 mins                     Jetta Lout PTA 05/29/20, 11:39 AM

## 2020-05-29 NOTE — Progress Notes (Addendum)
Patient discharged to [facility] per MD order. Report called to nurse Eulah Citizen at facility. EMS called for transport

## 2020-05-29 NOTE — Progress Notes (Signed)
   Subjective: 5 Days Post-Op Procedure(s) (LRB): LEFT TOTAL KNEE ARTHROPLASTY (Left) Patient reports pain as mild.  Improved from yesterday Patient is well, and has had no acute complaints or problems  no chest pain or SOB Denies any CP, SOB, ABD pain. We will continue therapy today.  Plan is to go Home after hospital stay.  Objective: Vital signs in last 24 hours: Temp:  [98 F (36.7 C)-98.8 F (37.1 C)] 98.8 F (37.1 C) (05/31 2245) Pulse Rate:  [101-105] 105 (05/31 2245) Resp:  [17-18] 17 (05/31 2245) BP: (115-133)/(42-70) 115/42 (05/31 2245) SpO2:  [78 %-97 %] 97 % (05/31 2245)  Intake/Output from previous day: 05/31 0701 - 06/01 0700 In: 840 [P.O.:840] Out: 1300 [Urine:1300] Intake/Output this shift: No intake/output data recorded.  No results for input(s): HGB in the last 72 hours. No results for input(s): WBC, RBC, HCT, PLT in the last 72 hours. No results for input(s): NA, K, CL, CO2, BUN, CREATININE, GLUCOSE, CALCIUM in the last 72 hours. No results for input(s): LABPT, INR in the last 72 hours.  EXAM General - Patient is Alert, Appropriate and Oriented  Heart - regular rhythm, tachy 110 Extremity - Neurovascular intact Sensation intact distally Intact pulses distally Dorsiflexion/Plantar flexion intact No cellulitis present Compartment soft Dressing - dressing C/D/I and no drainage, prevena intact with out drainage Motor Function - intact, moving foot and toes well on exam.   Past Medical History:  Diagnosis Date  . Arthritis   . Bladder spasms   . Complication of anesthesia   . Headache    H/O MIGRAINES  . Hypothyroidism   . IC (interstitial cystitis)   . Lipidemia   . PONV (postoperative nausea and vomiting)    X 2  . Vertigo    DUE TO ALLERGIES    Assessment/Plan:   5 Days Post-Op Procedure(s) (LRB): LEFT TOTAL KNEE ARTHROPLASTY (Left) Active Problems:   Knee joint replacement status, left  Estimated body mass index is 44 kg/m as  calculated from the following:   Height as of this encounter: 5' (1.524 m).   Weight as of this encounter: 102.2 kg. Advance diet Up with therapy  Needs BM VSS Discharge to SNF today  DVT Prophylaxis - Lovenox, Foot Pumps and TED hose Weight-Bearing as tolerated to left leg   T. Cranston Neighbor, PA-C [facility] 05/29/2020, 7:31 AM

## 2020-07-03 ENCOUNTER — Encounter: Payer: Self-pay | Admitting: Orthopedic Surgery

## 2020-08-26 ENCOUNTER — Ambulatory Visit
Admission: EM | Admit: 2020-08-26 | Discharge: 2020-08-26 | Disposition: A | Discharge status: Home / Self Care | Payer: Commercial Managed Care - PPO | Attending: Family Medicine | Admitting: Family Medicine

## 2020-08-26 ENCOUNTER — Other Ambulatory Visit: Payer: Self-pay

## 2020-08-26 DIAGNOSIS — B9689 Other specified bacterial agents as the cause of diseases classified elsewhere: Secondary | ICD-10-CM

## 2020-08-26 DIAGNOSIS — N760 Acute vaginitis: Secondary | ICD-10-CM | POA: Diagnosis present

## 2020-08-26 LAB — WET PREP, GENITAL
Sperm: NONE SEEN
Trich, Wet Prep: NONE SEEN
Yeast Wet Prep HPF POC: NONE SEEN

## 2020-08-26 LAB — URINALYSIS, COMPLETE (UACMP) WITH MICROSCOPIC
Bilirubin Urine: NEGATIVE
Glucose, UA: NEGATIVE mg/dL
Hgb urine dipstick: NEGATIVE
Ketones, ur: NEGATIVE mg/dL
Leukocytes,Ua: NEGATIVE
Nitrite: NEGATIVE
Protein, ur: NEGATIVE mg/dL
RBC / HPF: NONE SEEN RBC/hpf (ref 0–5)
Specific Gravity, Urine: 1.01 (ref 1.005–1.030)
pH: 7 (ref 5.0–8.0)

## 2020-08-26 MED ORDER — METRONIDAZOLE 500 MG PO TABS
500.0000 mg | ORAL_TABLET | Freq: Two times a day (BID) | ORAL | 0 refills | Status: DC
Start: 2020-08-26 — End: 2020-11-05

## 2020-08-26 NOTE — ED Triage Notes (Signed)
Patient states that she has been having urinary urgency and frequency with burning and bladder pressure x 2 days.

## 2020-08-26 NOTE — ED Provider Notes (Signed)
[facility]    CSN: [id number] Arrival date & time: 08/26/20  0908  History   Chief Complaint Chief Complaint  Patient presents with  . Urinary Frequency   HPI  64 year old female presents with concern for UTI.  Symptoms started on Friday.  Friday night she had nocturia.  She got up several times in the night to urinate.  On Saturday, continued to have urinary frequency and also developed dysuria after urination.  Symptoms have continued to persist.  Denies abdominal pain.  Denies flank pain.  Does report some low back pain.  No fever.  No relieving factors.  No medications or interventions tried.  No other complaints at this time.  Past Medical History:  Diagnosis Date  . Arthritis   . Bladder spasms   . Complication of anesthesia   . Headache    H/O MIGRAINES  . Hypothyroidism   . IC (interstitial cystitis)   . Lipidemia   . PONV (postoperative nausea and vomiting)    X 2  . Vertigo    DUE TO ALLERGIES    Patient Active Problem List   Diagnosis Date Noted  . Knee joint replacement status, left 05/24/2020    Past Surgical History:  Procedure Laterality Date  . ABDOMINAL HYSTERECTOMY    . APPENDECTOMY    . BLADDER SURGERY     X2  . INTERSTIM IMPLANT PLACEMENT     BLADDER-DOES NOT WORK DUE TO BATTERY  . RECTOVAGINAL FISTULA CLOSURE    . TOTAL KNEE ARTHROPLASTY Left 05/24/2020   Procedure: LEFT TOTAL KNEE ARTHROPLASTY;  Surgeon: Kennedy Bucker, MD;  Location: [facility] ORS;  Service: Orthopedics;  Laterality: Left;    OB History   No obstetric history on file.      Home Medications    Prior to Admission medications   Medication Sig Start Date End Date Taking? Authorizing Provider  amitriptyline (ELAVIL) 50 MG tablet Take 50 mg by mouth at bedtime.  01/25/19  Yes [provider]  atorvastatin (LIPITOR) 40 MG tablet Take 40 mg by mouth every morning.    Yes [provider]  cetirizine (ZYRTEC) 10 MG tablet Take 1 tablet (10 mg total)  by mouth daily. If needed at night for itching not relieved by Claritin in the morning. 05/15/15  Yes Hassan Rowan, MD  cholecalciferol (VITAMIN D3) 25 MCG (1000 UNIT) tablet Take 1,000 Units by mouth daily.   Yes [provider]  diphenhydrAMINE (BENADRYL) 25 MG tablet Take 25 mg by mouth as needed.    Yes [provider]  docusate sodium (COLACE) 100 MG capsule Take 1 capsule (100 mg total) by mouth 2 (two) times daily. 05/26/20  Yes Evon Slack, PA-C  levothyroxine (SYNTHROID, LEVOTHROID) 50 MCG tablet Take 50 mcg by mouth daily before breakfast.   Yes [provider]  Multiple Vitamin (MULTIVITAMIN WITH MINERALS) TABS tablet Take 1 tablet by mouth daily.   Yes [provider]  Potassium 99 MG TABS Take 99 mg by mouth at bedtime.   Yes [provider]  zolpidem (AMBIEN) 10 MG tablet Take 10 mg by mouth at bedtime.    Yes [provider]  enoxaparin (LOVENOX) 40 MG/0.4ML injection Inject 0.4 mLs (40 mg total) into the skin daily for 14 days. 05/26/20 06/09/20  Evon Slack, PA-C  metroNIDAZOLE (FLAGYL) 500 MG tablet Take 1 tablet (500 mg total) by mouth 2 (two) times daily. 08/26/20   Tommie Sams, DO  loratadine (CLARITIN) 10 MG tablet  Take 1 tablet (10 mg total) by mouth daily. Take 1 tablet in the morning. As needed for itching. Patient not taking: Reported on 05/09/2020 05/15/15 08/26/20  Hassan Rowan, MD  ranitidine (ZANTAC) 150 MG capsule Take 1 capsule (150 mg total) by mouth 2 (two) times daily. Patient not taking: Reported on 05/09/2020 05/15/15 08/26/20  Hassan Rowan, MD    Family History Family History  Problem Relation Age of Onset  . Cancer Mother     Social History Social History   Tobacco Use  . Smoking status: Never Smoker  . Smokeless tobacco: Never Used  Vaping Use  . Vaping Use: Never used  Substance Use Topics  . Alcohol use: No  . Drug use: No     Allergies   Morphine and related   Review of  Systems Review of Systems  Constitutional: Negative for fever.  Genitourinary: Positive for dysuria and frequency.  Musculoskeletal: Positive for back pain.   Physical Exam Triage Vital Signs ED Triage Vitals  Enc Vitals Group     BP 08/26/20 0927 129/70     Pulse Rate 08/26/20 0927 93     Resp 08/26/20 0927 18     Temp 08/26/20 0927 98.3 F (36.8 C)     Temp Source 08/26/20 0927 Oral     SpO2 08/26/20 0927 97 %     Weight 08/26/20 0922 205 lb (93 kg)     Height 08/26/20 0922 5' (1.524 m)     Head Circumference --      Peak Flow --      Pain Score 08/26/20 0922 3     Pain Loc --      Pain Edu? --      Excl. in GC? --    Updated Vital Signs BP 129/70 (BP Location: Right Arm)   Pulse 93   Temp 98.3 F (36.8 C) (Oral)   Resp 18   Ht 5' (1.524 m)   Wt 93 kg   SpO2 97%   BMI 40.04 kg/m   Visual Acuity Right Eye Distance:   Left Eye Distance:   Bilateral Distance:    Right Eye Near:   Left Eye Near:    Bilateral Near:     Physical Exam Constitutional:      General: She is not in acute distress.    Appearance: Normal appearance. She is obese. She is not ill-appearing.  HENT:     Head: Normocephalic and atraumatic.  Eyes:     General:        Right eye: No discharge.        Left eye: No discharge.     Conjunctiva/sclera: Conjunctivae normal.  Cardiovascular:     Rate and Rhythm: Normal rate and regular rhythm.  Pulmonary:     Effort: Pulmonary effort is normal.     Breath sounds: Normal breath sounds. No wheezing, rhonchi or rales.  Abdominal:     General: There is no distension.     Palpations: Abdomen is soft.     Tenderness: There is no abdominal tenderness.  Neurological:     Mental Status: She is alert.  Psychiatric:        Mood and Affect: Mood normal.        Behavior: Behavior normal.    UC Treatments / Results  Labs (all labs ordered are listed, but only abnormal results are displayed) Labs Reviewed  WET PREP, GENITAL - Abnormal; Notable  for the following components:  Result Value   Clue Cells Wet Prep HPF POC PRESENT (*)    WBC, Wet Prep HPF POC RARE (*)    All other components within normal limits  URINALYSIS, COMPLETE (UACMP) WITH MICROSCOPIC - Abnormal; Notable for the following components:   Bacteria, UA RARE (*)    All other components within normal limits    EKG   Radiology No results found.  Procedures Procedures (including critical care time)  Medications Ordered in UC Medications - No data to display  Initial Impression / Assessment and Plan / UC Course  I have reviewed the triage vital signs and the nursing notes.  Pertinent labs & imaging results that were available during my care of the patient were reviewed by me and considered in my medical decision making (see chart for details).    64 year old female presents with concern of UTI.  Urinalysis normal.  Microscopy essentially normal as well.  Given normal urine findings, wet prep was obtained and revealed bacterial vaginosis.  Treating with Flagyl.  Final Clinical Impressions(s) / UC Diagnoses   Final diagnoses:  Bacterial vaginosis   Discharge Instructions   None    ED Prescriptions    Medication Sig Dispense Auth. Provider   metroNIDAZOLE (FLAGYL) 500 MG tablet Take 1 tablet (500 mg total) by mouth 2 (two) times daily. 14 tablet Tommie Sams, DO     PDMP not reviewed this encounter.   Tommie Sams, [place] 08/26/20 1014

## 2020-09-19 ENCOUNTER — Other Ambulatory Visit: Payer: Self-pay | Admitting: Orthopedic Surgery

## 2020-09-19 DIAGNOSIS — M1711 Unilateral primary osteoarthritis, right knee: Secondary | ICD-10-CM

## 2020-09-24 ENCOUNTER — Other Ambulatory Visit: Payer: Self-pay

## 2020-09-24 ENCOUNTER — Ambulatory Visit
Admission: RE | Admit: 2020-09-24 | Discharge: 2020-09-24 | Disposition: A | Discharge status: Home / Self Care | Payer: Commercial Managed Care - PPO | Source: Ambulatory Visit | Attending: Orthopedic Surgery | Admitting: Orthopedic Surgery

## 2020-09-24 DIAGNOSIS — M1711 Unilateral primary osteoarthritis, right knee: Secondary | ICD-10-CM

## 2020-10-23 ENCOUNTER — Other Ambulatory Visit: Payer: Self-pay | Admitting: Orthopedic Surgery

## 2020-11-05 ENCOUNTER — Other Ambulatory Visit: Payer: Self-pay

## 2020-11-05 ENCOUNTER — Encounter
Admission: RE | Admit: 2020-11-05 | Discharge: 2020-11-05 | Disposition: A | Discharge status: Home / Self Care | Payer: Commercial Managed Care - PPO | Source: Ambulatory Visit | Attending: Orthopedic Surgery | Admitting: Orthopedic Surgery

## 2020-11-05 DIAGNOSIS — Z01818 Encounter for other preprocedural examination: Secondary | ICD-10-CM | POA: Insufficient documentation

## 2020-11-05 LAB — URINALYSIS, ROUTINE W REFLEX MICROSCOPIC
Bilirubin Urine: NEGATIVE
Glucose, UA: NEGATIVE mg/dL
Hgb urine dipstick: NEGATIVE
Ketones, ur: NEGATIVE mg/dL
Leukocytes,Ua: NEGATIVE
Nitrite: NEGATIVE
Protein, ur: NEGATIVE mg/dL
Specific Gravity, Urine: 1.02 (ref 1.005–1.030)
pH: 5 (ref 5.0–8.0)

## 2020-11-05 LAB — CBC WITH DIFFERENTIAL/PLATELET
Abs Immature Granulocytes: 0.02 10*3/uL (ref 0.00–0.07)
Basophils Absolute: 0.1 10*3/uL (ref 0.0–0.1)
Basophils Relative: 1 %
Eosinophils Absolute: 0.1 10*3/uL (ref 0.0–0.5)
Eosinophils Relative: 2 %
HCT: 39.9 % (ref 36.0–46.0)
Hemoglobin: 12.8 g/dL (ref 12.0–15.0)
Immature Granulocytes: 0 %
Lymphocytes Relative: 35 %
Lymphs Abs: 1.9 10*3/uL (ref 0.7–4.0)
MCH: 27.7 pg (ref 26.0–34.0)
MCHC: 32.1 g/dL (ref 30.0–36.0)
MCV: 86.4 fL (ref 80.0–100.0)
Monocytes Absolute: 0.3 10*3/uL (ref 0.1–1.0)
Monocytes Relative: 6 %
Neutro Abs: 3 10*3/uL (ref 1.7–7.7)
Neutrophils Relative %: 56 %
Platelets: 241 10*3/uL (ref 150–400)
RBC: 4.62 MIL/uL (ref 3.87–5.11)
RDW: 14.1 % (ref 11.5–15.5)
WBC: 5.4 10*3/uL (ref 4.0–10.5)
nRBC: 0 % (ref 0.0–0.2)

## 2020-11-05 LAB — COMPREHENSIVE METABOLIC PANEL
ALT: 17 U/L (ref 0–44)
AST: 20 U/L (ref 15–41)
Albumin: 4.3 g/dL (ref 3.5–5.0)
Alkaline Phosphatase: 97 U/L (ref 38–126)
Anion gap: 10 (ref 5–15)
BUN: 11 mg/dL (ref 8–23)
CO2: 26 mmol/L (ref 22–32)
Calcium: 9.4 mg/dL (ref 8.9–10.3)
Chloride: 105 mmol/L (ref 98–111)
Creatinine, Ser: 0.7 mg/dL (ref 0.44–1.00)
GFR, Estimated: >60 mL/min (ref >60)
Glucose, Bld: 98 mg/dL (ref 70–99)
Potassium: 4.3 mmol/L (ref 3.5–5.1)
Sodium: 141 mmol/L (ref 135–145)
Total Bilirubin: 0.5 mg/dL (ref 0.3–1.2)
Total Protein: 7.5 g/dL (ref 6.5–8.1)

## 2020-11-05 LAB — TYPE AND SCREEN
ABO/RH(D): O POS
Antibody Screen: NEGATIVE

## 2020-11-05 LAB — SURGICAL PCR SCREEN
MRSA, PCR: NEGATIVE
Staphylococcus aureus: NEGATIVE

## 2020-11-05 NOTE — Patient Instructions (Signed)
Your procedure is scheduled on: 11/16/20 Report to [place]. To find out your arrival time please call [phone] between 1PM - 3PM on 11/15/20.  Remember: Instructions that are not followed completely may result in serious medical risk, up to and including death, or upon the discretion of your surgeon and anesthesiologist your surgery may need to be rescheduled.     _X__ 1. Do not eat food after midnight the night before your procedure.                 No gum chewing or hard candies. You may drink clear liquids up to 2 hours                 before you are scheduled to arrive for your surgery- DO not drink clear                 liquids within 2 hours of the start of your surgery.                 Clear Liquids include:  water, apple juice without pulp, clear carbohydrate                 drink such as Clearfast or Gatorade, Black Coffee or Tea (Do not add                 anything to coffee or tea). Diabetics water only  __X__2.  On the morning of surgery brush your teeth with toothpaste and water, you                 may rinse your mouth with mouthwash if you wish.  Do not swallow any              toothpaste of mouthwash.     _X__ 3.  No Alcohol for 24 hours before or after surgery.   _X__ 4.  Do Not Smoke or use e-cigarettes For 24 Hours Prior to Your Surgery.                 Do not use any chewable tobacco products for at least 6 hours prior to                 surgery.  ____  5.  Bring all medications with you on the day of surgery if instructed.   __X__  6.  Notify your doctor if there is any change in your medical condition      (cold, fever, infections).     Do not wear jewelry, make-up, hairpins, clips or nail polish. Do not wear lotions, powders, or perfumes.  Do not shave 48 hours prior to surgery. Men may shave face and neck. Do not bring valuables to the hospital.    [facility] is not responsible for any belongings  or valuables.  Contacts, dentures/partials or body piercings may not be worn into surgery. Bring a case for your contacts, glasses or hearing aids, a denture cup will be supplied. Leave your suitcase in the car. After surgery it may be brought to your room. For patients admitted to the hospital, discharge time is determined by your treatment team.   Patients discharged the day of surgery will not be allowed to drive home.   Please read over the following fact sheets that you were given:   MRSA Information  __X__ Take these medicines the morning of surgery with A SIP OF WATER:  1. atorvastatin (LIPITOR) 40 MG tablet  2. levothyroxine (SYNTHROID, LEVOTHROID) 50 MCG tablet  3.   4.  5.  6.  ____ Fleet Enema (as directed)   __X__ Use CHG Soap/SAGE wipes as directed  ____ Use inhalers on the day of surgery  ____ Stop metformin/Janumet/Farxiga 2 days prior to surgery    ____ Take 1/2 of usual insulin dose the night before surgery. No insulin the morning          of surgery.   ____ Stop Blood Thinners Coumadin/Plavix/Xarelto/Pleta/Pradaxa/Eliquis/Effient/Aspirin  on   Or contact your Surgeon, Cardiologist or Medical Doctor regarding  ability to stop your blood thinners  __X__ Stop Anti-inflammatories 7 days before surgery such as Advil, Ibuprofen, Motrin,  BC or Goodies Powder, Naprosyn, Naproxen, Aleve, Aspirin    __X__ Stop all herbal supplements, fish oil or vitamin E until after surgery.    ____ Bring C-Pap to the hospital.

## 2020-11-14 ENCOUNTER — Other Ambulatory Visit: Payer: Self-pay

## 2020-11-14 ENCOUNTER — Other Ambulatory Visit
Admission: RE | Admit: 2020-11-14 | Discharge: 2020-11-14 | Disposition: A | Discharge status: Home / Self Care | Payer: Commercial Managed Care - PPO | Source: Ambulatory Visit | Attending: Orthopedic Surgery | Admitting: Orthopedic Surgery

## 2020-11-14 DIAGNOSIS — Z20822 Contact with and (suspected) exposure to covid-19: Secondary | ICD-10-CM | POA: Insufficient documentation

## 2020-11-14 DIAGNOSIS — Z01812 Encounter for preprocedural laboratory examination: Secondary | ICD-10-CM | POA: Insufficient documentation

## 2020-11-14 LAB — SARS CORONAVIRUS 2 (TAT 6-24 HRS): SARS Coronavirus 2: NEGATIVE

## 2020-11-15 MED ORDER — ORAL CARE MOUTH RINSE: 15.0000 mL | Freq: Once | OROMUCOSAL | Status: AC

## 2020-11-15 MED ORDER — LACTATED RINGERS IV SOLN: INTRAVENOUS | Status: DC

## 2020-11-15 MED ORDER — CHLORHEXIDINE GLUCONATE 0.12 % MT SOLN: 15.0000 mL | Freq: Once | OROMUCOSAL | Status: AC

## 2020-11-15 MED ORDER — FAMOTIDINE 20 MG PO TABS: 20.0000 mg | ORAL_TABLET | Freq: Once | ORAL | Status: AC

## 2020-11-15 MED ORDER — CEFAZOLIN SODIUM-DEXTROSE 2-4 GM/100ML-% IV SOLN
2.0000 g | INTRAVENOUS | Status: AC
  Administered 2020-11-16: 2 g via INTRAVENOUS

## 2020-11-16 ENCOUNTER — Inpatient Hospital Stay: Payer: Commercial Managed Care - PPO | Admitting: Anesthesiology

## 2020-11-16 ENCOUNTER — Encounter
Admission: RE | Disposition: A | Discharge status: Home / Self Care | Payer: Self-pay | Source: Home / Self Care | Attending: Orthopedic Surgery

## 2020-11-16 ENCOUNTER — Inpatient Hospital Stay
Admission: RE | Admit: 2020-11-16 | Discharge: 2020-11-20 | DRG: 470 | Disposition: A | Discharge status: Home Health | Payer: Commercial Managed Care - PPO | Attending: Orthopedic Surgery | Admitting: Orthopedic Surgery

## 2020-11-16 ENCOUNTER — Inpatient Hospital Stay: Payer: Commercial Managed Care - PPO

## 2020-11-16 ENCOUNTER — Other Ambulatory Visit: Payer: Self-pay

## 2020-11-16 ENCOUNTER — Encounter: Payer: Self-pay | Admitting: Orthopedic Surgery

## 2020-11-16 DIAGNOSIS — E785 Hyperlipidemia, unspecified: Secondary | ICD-10-CM | POA: Diagnosis present

## 2020-11-16 DIAGNOSIS — Z20822 Contact with and (suspected) exposure to covid-19: Secondary | ICD-10-CM | POA: Diagnosis present

## 2020-11-16 DIAGNOSIS — M1711 Unilateral primary osteoarthritis, right knee: Principal | ICD-10-CM | POA: Diagnosis present

## 2020-11-16 DIAGNOSIS — G4700 Insomnia, unspecified: Secondary | ICD-10-CM | POA: Diagnosis present

## 2020-11-16 DIAGNOSIS — E039 Hypothyroidism, unspecified: Secondary | ICD-10-CM | POA: Diagnosis present

## 2020-11-16 DIAGNOSIS — Z96651 Presence of right artificial knee joint: Secondary | ICD-10-CM

## 2020-11-16 DIAGNOSIS — Z96652 Presence of left artificial knee joint: Secondary | ICD-10-CM | POA: Diagnosis present

## 2020-11-16 DIAGNOSIS — N3010 Interstitial cystitis (chronic) without hematuria: Secondary | ICD-10-CM | POA: Diagnosis present

## 2020-11-16 DIAGNOSIS — Z96642 Presence of left artificial hip joint: Secondary | ICD-10-CM | POA: Diagnosis present

## 2020-11-16 DIAGNOSIS — I10 Essential (primary) hypertension: Secondary | ICD-10-CM | POA: Diagnosis present

## 2020-11-16 DIAGNOSIS — G43909 Migraine, unspecified, not intractable, without status migrainosus: Secondary | ICD-10-CM | POA: Diagnosis present

## 2020-11-16 DIAGNOSIS — G8918 Other acute postprocedural pain: Secondary | ICD-10-CM

## 2020-11-16 HISTORY — PX: TOTAL KNEE ARTHROPLASTY: SHX125

## 2020-11-16 LAB — CBC
HCT: 36.5 % (ref 36.0–46.0)
Hemoglobin: 11.8 g/dL — ABNORMAL LOW (ref 12.0–15.0)
MCH: 28 pg (ref 26.0–34.0)
MCHC: 32.3 g/dL (ref 30.0–36.0)
MCV: 86.5 fL (ref 80.0–100.0)
Platelets: 258 10*3/uL (ref 150–400)
RBC: 4.22 MIL/uL (ref 3.87–5.11)
RDW: 14.1 % (ref 11.5–15.5)
WBC: 9.2 10*3/uL (ref 4.0–10.5)
nRBC: 0 % (ref 0.0–0.2)

## 2020-11-16 LAB — CREATININE, SERUM
Creatinine, Ser: 0.64 mg/dL (ref 0.44–1.00)
GFR, Estimated: >60 mL/min (ref >60)

## 2020-11-16 SURGERY — ARTHROPLASTY, KNEE, TOTAL
Anesthesia: Spinal | Site: Knee | Laterality: Right

## 2020-11-16 MED ORDER — PROPOFOL 10 MG/ML IV BOLUS
INTRAVENOUS | Status: DC | PRN
  Administered 2020-11-16: 20 mg via INTRAVENOUS
  Administered 2020-11-16: 50 mg via INTRAVENOUS
  Administered 2020-11-16: 30 mg via INTRAVENOUS

## 2020-11-16 MED ORDER — KETOROLAC TROMETHAMINE 30 MG/ML IJ SOLN
INTRAMUSCULAR | Status: DC | PRN
  Administered 2020-11-16: 30 mg

## 2020-11-16 MED ORDER — MIDAZOLAM HCL 2 MG/2ML IJ SOLN
INTRAMUSCULAR | Status: AC
  Filled 2020-11-16: qty 2

## 2020-11-16 MED ORDER — SODIUM CHLORIDE FLUSH 0.9 % IV SOLN
INTRAVENOUS | Status: AC
  Filled 2020-11-16: qty 40

## 2020-11-16 MED ORDER — PROPOFOL 500 MG/50ML IV EMUL
INTRAVENOUS | Status: DC | PRN
  Administered 2020-11-16: 100 ug/kg/min via INTRAVENOUS

## 2020-11-16 MED ORDER — ACETAMINOPHEN 325 MG PO TABS: 325.0000 mg | ORAL_TABLET | Freq: Four times a day (QID) | ORAL | Status: DC | PRN

## 2020-11-16 MED ORDER — MENTHOL 3 MG MT LOZG
1.0000 | LOZENGE | OROMUCOSAL | Status: DC | PRN
  Filled 2020-11-16: qty 9

## 2020-11-16 MED ORDER — CYCLOBENZAPRINE HCL 10 MG PO TABS: 5.0000 mg | ORAL_TABLET | Freq: Two times a day (BID) | ORAL | Status: DC | PRN

## 2020-11-16 MED ORDER — NEOMYCIN-POLYMYXIN B GU 40-200000 IR SOLN
Status: DC | PRN
  Administered 2020-11-16: 16 mL

## 2020-11-16 MED ORDER — BUPIVACAINE HCL (PF) 0.5 % IJ SOLN
INTRAMUSCULAR | Status: DC | PRN
  Administered 2020-11-16: 3 mL

## 2020-11-16 MED ORDER — ONDANSETRON HCL 4 MG/2ML IJ SOLN
4.0000 mg | Freq: Four times a day (QID) | INTRAMUSCULAR | Status: DC | PRN
  Administered 2020-11-17: 4 mg via INTRAVENOUS
  Filled 2020-11-16: qty 2

## 2020-11-16 MED ORDER — CEFAZOLIN SODIUM-DEXTROSE 2-4 GM/100ML-% IV SOLN
INTRAVENOUS | Status: AC
  Filled 2020-11-16: qty 100

## 2020-11-16 MED ORDER — ONDANSETRON HCL 4 MG/2ML IJ SOLN
INTRAMUSCULAR | Status: AC
  Filled 2020-11-16: qty 2

## 2020-11-16 MED ORDER — NEOMYCIN-POLYMYXIN B GU 40-200000 IR SOLN
Status: AC
  Filled 2020-11-16: qty 20

## 2020-11-16 MED ORDER — DOCUSATE SODIUM 100 MG PO CAPS
100.0000 mg | ORAL_CAPSULE | Freq: Two times a day (BID) | ORAL | Status: DC
  Administered 2020-11-16 – 2020-11-20 (×8): 100 mg via ORAL
  Filled 2020-11-16 (×8): qty 1

## 2020-11-16 MED ORDER — KETOROLAC TROMETHAMINE 30 MG/ML IJ SOLN
INTRAMUSCULAR | Status: AC
  Filled 2020-11-16: qty 1

## 2020-11-16 MED ORDER — ATORVASTATIN CALCIUM 20 MG PO TABS
40.0000 mg | ORAL_TABLET | Freq: Every day | ORAL | Status: DC
  Administered 2020-11-17 – 2020-11-20 (×4): 40 mg via ORAL
  Filled 2020-11-16 (×4): qty 2

## 2020-11-16 MED ORDER — OXYCODONE HCL 5 MG PO TABS
5.0000 mg | ORAL_TABLET | ORAL | Status: DC | PRN
  Administered 2020-11-17: 10 mg via ORAL
  Administered 2020-11-18 – 2020-11-20 (×3): 5 mg via ORAL
  Filled 2020-11-16: qty 2
  Filled 2020-11-16 (×2): qty 1
  Filled 2020-11-16: qty 2

## 2020-11-16 MED ORDER — FAMOTIDINE 20 MG PO TABS
ORAL_TABLET | ORAL | Status: AC
  Administered 2020-11-16: 20 mg via ORAL
  Filled 2020-11-16: qty 1

## 2020-11-16 MED ORDER — SODIUM CHLORIDE 0.9 % IV SOLN
INTRAVENOUS | Status: DC | PRN
  Administered 2020-11-16: 30 ug/min via INTRAVENOUS

## 2020-11-16 MED ORDER — FENTANYL CITRATE (PF) 100 MCG/2ML IJ SOLN
INTRAMUSCULAR | Status: AC
  Filled 2020-11-16: qty 2

## 2020-11-16 MED ORDER — ENOXAPARIN SODIUM 30 MG/0.3ML ~~LOC~~ SOLN
30.0000 mg | Freq: Two times a day (BID) | SUBCUTANEOUS | Status: DC
  Administered 2020-11-17 – 2020-11-20 (×7): 30 mg via SUBCUTANEOUS
  Filled 2020-11-16 (×7): qty 0.3

## 2020-11-16 MED ORDER — PHENYLEPHRINE HCL (PRESSORS) 10 MG/ML IV SOLN
INTRAVENOUS | Status: DC | PRN
  Administered 2020-11-16: 100 ug via INTRAVENOUS

## 2020-11-16 MED ORDER — AMITRIPTYLINE HCL 50 MG PO TABS
50.0000 mg | ORAL_TABLET | Freq: Every day | ORAL | Status: DC
  Administered 2020-11-16 – 2020-11-19 (×4): 50 mg via ORAL
  Filled 2020-11-16: qty 1
  Filled 2020-11-16: qty 2
  Filled 2020-11-16: qty 1
  Filled 2020-11-16 (×3): qty 2
  Filled 2020-11-16 (×3): qty 1

## 2020-11-16 MED ORDER — LEVOTHYROXINE SODIUM 50 MCG PO TABS
50.0000 ug | ORAL_TABLET | Freq: Every day | ORAL | Status: DC
  Administered 2020-11-17 – 2020-11-20 (×4): 50 ug via ORAL
  Filled 2020-11-16 (×4): qty 1

## 2020-11-16 MED ORDER — CEFAZOLIN SODIUM-DEXTROSE 2-4 GM/100ML-% IV SOLN
2.0000 g | Freq: Four times a day (QID) | INTRAVENOUS | Status: AC
  Administered 2020-11-16 (×2): 2 g via INTRAVENOUS
  Filled 2020-11-16 (×2): qty 100

## 2020-11-16 MED ORDER — FENTANYL CITRATE (PF) 100 MCG/2ML IJ SOLN: 25.0000 ug | INTRAMUSCULAR | Status: DC | PRN

## 2020-11-16 MED ORDER — MIDAZOLAM HCL 5 MG/5ML IJ SOLN
INTRAMUSCULAR | Status: DC | PRN
  Administered 2020-11-16: 2 mg via INTRAVENOUS

## 2020-11-16 MED ORDER — ACETAMINOPHEN 10 MG/ML IV SOLN
INTRAVENOUS | Status: DC | PRN
  Administered 2020-11-16: 1000 mg via INTRAVENOUS

## 2020-11-16 MED ORDER — FENTANYL CITRATE (PF) 100 MCG/2ML IJ SOLN
INTRAMUSCULAR | Status: DC | PRN
  Administered 2020-11-16 (×4): 25 ug via INTRAVENOUS

## 2020-11-16 MED ORDER — OXYCODONE HCL 5 MG PO TABS
10.0000 mg | ORAL_TABLET | ORAL | Status: DC | PRN
  Administered 2020-11-16 – 2020-11-17 (×4): 10 mg via ORAL
  Filled 2020-11-16 (×4): qty 2

## 2020-11-16 MED ORDER — SODIUM CHLORIDE 0.9 % IV SOLN: INTRAVENOUS | Status: DC

## 2020-11-16 MED ORDER — BUPIVACAINE-EPINEPHRINE (PF) 0.25% -1:200000 IJ SOLN
INTRAMUSCULAR | Status: DC | PRN
  Administered 2020-11-16: 30 mL

## 2020-11-16 MED ORDER — DIPHENHYDRAMINE HCL 25 MG PO CAPS
25.0000 mg | ORAL_CAPSULE | Freq: Three times a day (TID) | ORAL | Status: DC | PRN
  Administered 2020-11-18 – 2020-11-20 (×4): 25 mg via ORAL
  Filled 2020-11-16 (×5): qty 1

## 2020-11-16 MED ORDER — ACETAMINOPHEN 500 MG PO TABS: 500.0000 mg | ORAL_TABLET | Freq: Every evening | ORAL | Status: DC | PRN

## 2020-11-16 MED ORDER — ACETAMINOPHEN 10 MG/ML IV SOLN
INTRAVENOUS | Status: AC
  Filled 2020-11-16: qty 100

## 2020-11-16 MED ORDER — ZOLPIDEM TARTRATE 5 MG PO TABS
5.0000 mg | ORAL_TABLET | Freq: Every day | ORAL | Status: DC
  Administered 2020-11-16 – 2020-11-19 (×4): 5 mg via ORAL
  Filled 2020-11-16 (×4): qty 1

## 2020-11-16 MED ORDER — DIPHENHYDRAMINE HCL 25 MG PO CAPS: 25.0000 mg | ORAL_CAPSULE | Freq: Every evening | ORAL | Status: DC | PRN

## 2020-11-16 MED ORDER — BUPIVACAINE-EPINEPHRINE (PF) 0.25% -1:200000 IJ SOLN
INTRAMUSCULAR | Status: AC
  Filled 2020-11-16: qty 30

## 2020-11-16 MED ORDER — ALUM & MAG HYDROXIDE-SIMETH 200-200-20 MG/5ML PO SUSP: 30.0000 mL | ORAL | Status: DC | PRN

## 2020-11-16 MED ORDER — DIPHENHYDRAMINE-APAP (SLEEP) 25-500 MG PO TABS: 1.0000 | ORAL_TABLET | Freq: Every evening | ORAL | Status: DC | PRN

## 2020-11-16 MED ORDER — PHENOL 1.4 % MT LIQD
1.0000 | OROMUCOSAL | Status: DC | PRN
  Filled 2020-11-16: qty 177

## 2020-11-16 MED ORDER — POTASSIUM 99 MG PO TABS: 99.0000 mg | ORAL_TABLET | Freq: Every day | ORAL | Status: DC

## 2020-11-16 MED ORDER — TRAMADOL HCL 50 MG PO TABS
50.0000 mg | ORAL_TABLET | Freq: Four times a day (QID) | ORAL | Status: DC
  Administered 2020-11-16 – 2020-11-20 (×13): 50 mg via ORAL
  Filled 2020-11-16 (×15): qty 1

## 2020-11-16 MED ORDER — ONDANSETRON HCL 4 MG PO TABS
4.0000 mg | ORAL_TABLET | Freq: Four times a day (QID) | ORAL | Status: DC | PRN
  Administered 2020-11-16: 4 mg via ORAL
  Filled 2020-11-16: qty 1

## 2020-11-16 MED ORDER — METOCLOPRAMIDE HCL 10 MG PO TABS: 5.0000 mg | ORAL_TABLET | Freq: Three times a day (TID) | ORAL | Status: DC | PRN

## 2020-11-16 MED ORDER — ADULT MULTIVITAMIN W/MINERALS CH
1.0000 | ORAL_TABLET | Freq: Every day | ORAL | Status: DC
  Administered 2020-11-17 – 2020-11-20 (×4): 1 via ORAL
  Filled 2020-11-16 (×4): qty 1

## 2020-11-16 MED ORDER — SODIUM CHLORIDE 0.9 % IV SOLN
INTRAVENOUS | Status: DC | PRN
  Administered 2020-11-16: 60 mL

## 2020-11-16 MED ORDER — MAGNESIUM HYDROXIDE 400 MG/5ML PO SUSP
30.0000 mL | Freq: Every day | ORAL | Status: DC | PRN
  Administered 2020-11-18 – 2020-11-19 (×2): 30 mL via ORAL
  Filled 2020-11-16 (×3): qty 30

## 2020-11-16 MED ORDER — CHLORHEXIDINE GLUCONATE 0.12 % MT SOLN
OROMUCOSAL | Status: AC
  Administered 2020-11-16: 15 mL via OROMUCOSAL
  Filled 2020-11-16: qty 15

## 2020-11-16 MED ORDER — ONDANSETRON HCL 4 MG/2ML IJ SOLN
INTRAMUSCULAR | Status: DC | PRN
  Administered 2020-11-16: 4 mg via INTRAVENOUS

## 2020-11-16 MED ORDER — METOCLOPRAMIDE HCL 5 MG/ML IJ SOLN: 5.0000 mg | Freq: Three times a day (TID) | INTRAMUSCULAR | Status: DC | PRN

## 2020-11-16 MED ORDER — BISACODYL 10 MG RE SUPP
10.0000 mg | Freq: Every day | RECTAL | Status: DC | PRN
  Administered 2020-11-19: 10 mg via RECTAL
  Filled 2020-11-16 (×2): qty 1

## 2020-11-16 MED ORDER — CRANBERRY-VITAMIN C 250-60 MG PO CAPS: 2.0000 | ORAL_CAPSULE | Freq: Every day | ORAL | Status: DC

## 2020-11-16 MED ORDER — PROPOFOL 500 MG/50ML IV EMUL
INTRAVENOUS | Status: AC
  Filled 2020-11-16: qty 50

## 2020-11-16 MED ORDER — HYDROMORPHONE HCL 1 MG/ML IJ SOLN: 0.5000 mg | INTRAMUSCULAR | Status: DC | PRN

## 2020-11-16 MED ORDER — BUPIVACAINE LIPOSOME 1.3 % IJ SUSP
INTRAMUSCULAR | Status: AC
  Filled 2020-11-16: qty 20

## 2020-11-16 MED ORDER — VITAMIN D3 25 MCG (1000 UNIT) PO TABS
5000.0000 [IU] | ORAL_TABLET | Freq: Every day | ORAL | Status: DC
  Administered 2020-11-17 – 2020-11-20 (×4): 5000 [IU] via ORAL
  Filled 2020-11-16 (×8): qty 5

## 2020-11-16 MED ORDER — MAGNESIUM CITRATE PO SOLN
1.0000 | Freq: Once | ORAL | Status: DC | PRN
  Filled 2020-11-16: qty 296

## 2020-11-16 SURGICAL SUPPLY — 78 items
BLADE SAGITTAL 25.0X1.19X90 (BLADE) ×2 IMPLANT
BLADE SAGITTAL AGGR TOOTH XLG (BLADE) IMPLANT
BLADE SAW 90X13X1.19 OSCILLAT (BLADE) ×2 IMPLANT
BLOCK CUTTING FEMUR 3 RT MED (MISCELLANEOUS) ×2 IMPLANT
BLOCK CUTTING TIBIAL 2 RT (MISCELLANEOUS) ×2 IMPLANT
BLOCK CUTTING TIBIAL 4 RT MIS (MISCELLANEOUS) ×2 IMPLANT
BNDG ELASTIC 6X5.8 VLCR STR LF (GAUZE/BANDAGES/DRESSINGS) ×2 IMPLANT
CANISTER SUCT 1200ML W/VALVE (MISCELLANEOUS) ×2 IMPLANT
CANISTER SUCT 3000ML PPV (MISCELLANEOUS) ×4 IMPLANT
CANISTER WOUND CARE 500ML ATS (WOUND CARE) ×2 IMPLANT
CEMENT FEMORAL COMP SZ3 RT (Femur) ×2 IMPLANT
CEMENT HV SMART SET (Cement) ×4 IMPLANT
CHLORAPREP W/TINT 26 (MISCELLANEOUS) ×4 IMPLANT
COOLER POLAR GLACIER W/PUMP (MISCELLANEOUS) ×2 IMPLANT
COVER WAND RF STERILE (DRAPES) ×2 IMPLANT
CUFF TOURN SGL QUICK 24 (TOURNIQUET CUFF)
CUFF TOURN SGL QUICK 30 (TOURNIQUET CUFF)
CUFF TOURN SGL QUICK 34 (TOURNIQUET CUFF) ×2
CUFF TRNQT CYL 24X4X16.5-23 (TOURNIQUET CUFF) IMPLANT
CUFF TRNQT CYL 30X4X21-28X (TOURNIQUET CUFF) IMPLANT
CUFF TRNQT CYL 34X4.125X (TOURNIQUET CUFF) ×1 IMPLANT
DRAPE 3/4 80X56 (DRAPES) ×4 IMPLANT
DRSG MEPILEX SACRM 8.7X9.8 (GAUZE/BANDAGES/DRESSINGS) ×2 IMPLANT
ELECT CAUTERY BLADE 6.4 (BLADE) ×2 IMPLANT
ELECT REM PT RETURN 9FT ADLT (ELECTROSURGICAL) ×2
ELECTRODE REM PT RTRN 9FT ADLT (ELECTROSURGICAL) ×1 IMPLANT
FEMUR BONE MODEL (MISCELLANEOUS) ×2 IMPLANT
GAUZE SPONGE 4X4 12PLY STRL (GAUZE/BANDAGES/DRESSINGS) ×2 IMPLANT
GAUZE XEROFORM 1X8 LF (GAUZE/BANDAGES/DRESSINGS) ×2 IMPLANT
GLOVE BIOGEL PI IND STRL 9 (GLOVE) ×1 IMPLANT
GLOVE BIOGEL PI INDICATOR 9 (GLOVE) ×1
GLOVE INDICATOR 8.0 STRL GRN (GLOVE) ×2 IMPLANT
GLOVE SURG ORTHO 8.0 STRL STRW (GLOVE) ×2 IMPLANT
GLOVE SURG SYN 9.0  PF PI (GLOVE) ×2
GLOVE SURG SYN 9.0 PF PI (GLOVE) ×1 IMPLANT
GOWN SRG 2XL LVL 4 RGLN SLV (GOWNS) ×1 IMPLANT
GOWN STRL NON-REIN 2XL LVL4 (GOWNS) ×2
GOWN STRL REUS W/ TWL LRG LVL3 (GOWN DISPOSABLE) ×1 IMPLANT
GOWN STRL REUS W/ TWL XL LVL3 (GOWN DISPOSABLE) ×1 IMPLANT
GOWN STRL REUS W/TWL LRG LVL3 (GOWN DISPOSABLE) ×2
GOWN STRL REUS W/TWL XL LVL3 (GOWN DISPOSABLE) ×2
HOLDER FOLEY CATH W/STRAP (MISCELLANEOUS) ×2 IMPLANT
HOOD PEEL AWAY FLYTE STAYCOOL (MISCELLANEOUS) ×4 IMPLANT
INSERT TIBIAL FLEX SZ2 RT H17 (Insert) ×2 IMPLANT
IRRIGATION SURGIPHOR STRL (IV SOLUTION) IMPLANT
KIT PREVENA INCISION MGT20CM45 (CANNISTER) ×2 IMPLANT
KIT TURNOVER KIT A (KITS) ×2 IMPLANT
MANIFOLD NEPTUNE II (INSTRUMENTS) ×2 IMPLANT
NDL SAFETY ECLIPSE 18X1.5 (NEEDLE) ×1 IMPLANT
NEEDLE HYPO 18GX1.5 SHARP (NEEDLE) ×2
NEEDLE SPNL 18GX3.5 QUINCKE PK (NEEDLE) ×2 IMPLANT
NEEDLE SPNL 20GX3.5 QUINCKE YW (NEEDLE) ×2 IMPLANT
NS IRRIG 1000ML POUR BTL (IV SOLUTION) ×2 IMPLANT
PACK TOTAL KNEE (MISCELLANEOUS) ×2 IMPLANT
PAD WRAPON POLAR KNEE (MISCELLANEOUS) ×1 IMPLANT
PATELLA RESURFACING MEDACTA 02 (Bone Implant) ×2 IMPLANT
PENCIL SMOKE EVACUATOR COATED (MISCELLANEOUS) ×2 IMPLANT
PULSAVAC PLUS IRRIG FAN TIP (DISPOSABLE) ×2
SCALPEL PROTECTED #10 DISP (BLADE) ×4 IMPLANT
SOL .9 NS 3000ML IRR  AL (IV SOLUTION) ×2
SOL .9 NS 3000ML IRR UROMATIC (IV SOLUTION) ×1 IMPLANT
STAPLER SKIN PROX 35W (STAPLE) ×2 IMPLANT
STEM EXTENSION 11MMX30MM (Stem) ×2 IMPLANT
SUCTION FRAZIER HANDLE 10FR (MISCELLANEOUS) ×2
SUCTION TUBE FRAZIER 10FR DISP (MISCELLANEOUS) ×1 IMPLANT
SUT DVC 2 QUILL PDO  T11 36X36 (SUTURE) ×2
SUT DVC 2 QUILL PDO T11 36X36 (SUTURE) ×1 IMPLANT
SUT ETHIBOND 2 V 37 (SUTURE) IMPLANT
SUT V-LOC 90 ABS DVC 3-0 CL (SUTURE) ×2 IMPLANT
SYR 20ML LL LF (SYRINGE) ×2 IMPLANT
SYR 50ML LL SCALE MARK (SYRINGE) ×4 IMPLANT
SYR BULB IRRIG 60ML STRL (SYRINGE) ×2 IMPLANT
TIB TRAY SZ 2 R FIXED (Joint) ×2 IMPLANT
TIP FAN IRRIG PULSAVAC PLUS (DISPOSABLE) ×1 IMPLANT
TOWEL OR 17X26 4PK STRL BLUE (TOWEL DISPOSABLE) ×2 IMPLANT
TOWER CARTRIDGE SMART MIX (DISPOSABLE) ×2 IMPLANT
TRAY FOLEY MTR SLVR 16FR STAT (SET/KITS/TRAYS/PACK) ×2 IMPLANT
WRAPON POLAR PAD KNEE (MISCELLANEOUS) ×2

## 2020-11-16 NOTE — Anesthesia Preprocedure Evaluation (Signed)
Anesthesia Evaluation  Patient identified by MRN, date of birth, ID band Patient awake    Reviewed: Allergy & Precautions, H&P , NPO status , Patient's Chart, lab work & pertinent test results  History of Anesthesia Complications (+) PONV and history of anesthetic complications  Airway Mallampati: III  TM Distance: <3 FB Neck ROM: full    Dental  (+) Chipped, Caps, Poor Dentition   Pulmonary neg pulmonary ROS, neg shortness of breath,    Pulmonary exam normal        Cardiovascular Exercise Tolerance: Good negative cardio ROS Normal cardiovascular exam     Neuro/Psych  Headaches, negative psych ROS   GI/Hepatic negative GI ROS, Neg liver ROS,   Endo/Other  Hypothyroidism   Renal/GU      Musculoskeletal  (+) Arthritis ,   Abdominal   Peds  Hematology negative hematology ROS (+)   Anesthesia Other Findings Past Medical History: No date: Arthritis No date: Bladder spasms No date: Complication of anesthesia No date: Headache     Comment:  H/O MIGRAINES No date: Hypothyroidism No date: IC (interstitial cystitis) No date: Lipidemia No date: PONV (postoperative nausea and vomiting)     Comment:  X 2 No date: Vertigo     Comment:  DUE TO ALLERGIES  Past Surgical History: No date: ABDOMINAL HYSTERECTOMY No date: APPENDECTOMY No date: BLADDER SURGERY     Comment:  X2 No date: INTERSTIM IMPLANT PLACEMENT     Comment:  BLADDER-DOES NOT WORK DUE TO BATTERY No date: RECTOVAGINAL FISTULA CLOSURE 05/24/2020: TOTAL KNEE ARTHROPLASTY; Left     Comment:  Procedure: LEFT TOTAL KNEE ARTHROPLASTY;  Surgeon: Kennedy Bucker, MD;  Location: [facility] ORS;  Service: Orthopedics;               Laterality: Left;  BMI    Body Mass Index: 43.92 kg/m      Reproductive/Obstetrics negative OB ROS                             Anesthesia Physical Anesthesia Plan  ASA: III  Anesthesia Plan:  Spinal   Post-op Pain Management:    Induction:   PONV Risk Score and Plan:   Airway Management Planned: Natural Airway and Nasal Cannula  Additional Equipment:   Intra-op Plan:   Post-operative Plan:   Informed Consent: I have reviewed the patients History and Physical, chart, labs and discussed the procedure including the risks, benefits and alternatives for the proposed anesthesia with the patient or authorized representative who has indicated his/her understanding and acceptance.     Dental Advisory Given  Plan Discussed with: Anesthesiologist, CRNA and Surgeon  Anesthesia Plan Comments: (Patient reports no bleeding problems and no anticoagulant use.  Plan for spinal with backup GA  Patient consented for risks of anesthesia including but not limited to:  - adverse reactions to medications - damage to eyes, teeth, lips or other oral mucosa - nerve damage due to positioning  - risk of bleeding, infection and or nerve damage from spinal that could lead to paralysis - risk of headache or failed spinal - damage to teeth, lips or other oral mucosa - sore throat or hoarseness - damage to heart, brain, nerves, lungs, other parts of body or loss of life  Patient voiced understanding.)        Anesthesia Quick Evaluation

## 2020-11-16 NOTE — H&P (Signed)
Chief Complaint  Patient presents with  . Follow-up  Rt Knee OA    History of the Present Illness: Stacey Henson is a 64 y.o. female here today.   The patient presents for discussion of right total knee arthroplasty. She had x-rays on 09/19/2020 of the right knee that showed complete loss of medial joint space and severe patellofemoral arthritis. She had a prior left total knee arthroplasty. She has had multiple injections to her right knee in the past without relief. MyKnee CT has been completed on 09/24/2020.  The patient is employed at [employer] as a Production designer, theatre/television/film of a prior authorization team.   I have reviewed past medical, surgical, social and family history, and allergies as documented in the EMR.  Past Medical History: Past Medical History:  Diagnosis Date  . Hyperlipidemia  . Hypertension  . 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 Remus Loffler "for years"  . Interstitial cystitis  has a metronic device that has expired  . Migraine   Past Surgical History: Past Surgical History:  Procedure Laterality Date  . APPENDECTOMY  . ARTHROPLASTY HIP TOTAL Left 05/24/2020  Dr. Rosita Kea  . ARTHROPLASTY TOTAL KNEE Left 05/24/2020  Taiquan Campanaro  . HYSTERECTOMY  . JOINT REPLACEMENT  . TUBAL LIGATION   Past Family History: Family History  Problem Relation Age of Onset  . Hyperlipidemia (Elevated cholesterol) Mother  . Obesity Mother  . Hyperlipidemia (Elevated cholesterol) Father  . Colon polyps Brother  . Depression Paternal Uncle   Medications: Current Outpatient Medications Ordered in Epic  Medication Sig Dispense Refill  . amitriptyline (ELAVIL) 50 MG tablet Take 1 tablet by mouth nightly  . atorvastatin (LIPITOR) 40 MG tablet Take by mouth.  . cholecalciferol (VITAMIN D3) 1000 unit tablet Take by mouth  . diphenhydrAMINE (BENADRYL) 25 mg tablet Take by mouth  . levothyroxine (SYNTHROID, LEVOTHROID) 50 MCG tablet Take by mouth.  . potassium 99 mg Tab Take 1 tablet by mouth once daily   . zolpidem (AMBIEN) 10 mg tablet Take by mouth.   No current Epic-ordered facility-administered medications on file.   Allergies: No Known Allergies   Body mass index is 43.9 kg/m.  Review of Systems: A comprehensive 14 point ROS was performed, reviewed, and the pertinent orthopaedic findings are documented in the HPI.  Vitals:  10/17/20 0843  BP: 144/82    General Physical Examination:   General/Constitutional: No apparent distress: well-nourished and well developed. Eyes: Pupils equal, round with synchronous movement. Lungs: Clear to auscultation HEENT: Normal Vascular: No edema, swelling or tenderness, except as noted in detailed exam. Cardiac: Heart rate and rhythm is regular. Integumentary: No impressive skin lesions present, except as noted in detailed exam. Neuro/Psych: Normal mood and affect, oriented to person, place and time.  Musculoskeletal Examination:  On exam, right knee has range of motion of 0-110 degrees with crepitus. Patellofemoral compartment left knee arthritis.   Radiographs:  No new imaging studies were obtained or reviewed today.  Assessment: ICD-10-CM  1. Primary osteoarthritis of right knee M17.11   Plan:  The patient has clinical findings of right knee osteoarthritis.  We discussed the patient's prior x-ray findings. I explained right total knee arthroplasty in detail. The patient would like to proceed with right total knee arthroplasty in early 10/2020. She has been through this before and understands the risks, benefits, and possible complications.   Surgical Risks:  The nature of the condition and the proposed procedure has been reviewed in detail with the patient. Surgical versus non-surgical options and prognosis  for recovery have been reviewed and the inherent risks and benefits of each have been discussed including the risks of infection, bleeding, injury to nerves/blood vessels/tendons, incomplete relief of symptoms, persisting  pain and/or stiffness, loss of function, complex regional pain syndrome, failure of the procedure, as appropriate.  Teeth: Normal  Attestation: I, Dawn Royse, am documenting for [facility], MD utilizing Nuance DAX.     Electronically signed by Marlena Clipper, MD at 10/19/2020 6:11 PM EDT   Reviewed  H+P. No changes noted.

## 2020-11-16 NOTE — Anesthesia Procedure Notes (Signed)
Spinal  Patient location during procedure: OR Start time: 11/16/2020 12:05 PM End time: 11/16/2020 12:10 PM Staffing Performed: resident/CRNA  Anesthesiologist: Piscitello, Precious Haws, MD Resident/CRNA: Hedda Slade, CRNA Preanesthetic Checklist Completed: patient identified, IV checked, site marked, risks and benefits discussed, surgical consent, monitors and equipment checked, pre-op evaluation and timeout performed Spinal Block Patient position: sitting Prep: ChloraPrep Patient monitoring: heart rate, continuous pulse ox, blood pressure and cardiac monitor Approach: midline Location: L3-4 Injection technique: single-shot Needle Needle type: Whitacre and Introducer  Needle gauge: 24 G Needle length: 9 cm Assessment Sensory level: T10 Additional Notes Negative paresthesia. Negative blood return. Positive free-flowing CSF. Expiration date of kit checked and confirmed. Patient tolerated procedure well, without complications.

## 2020-11-16 NOTE — Evaluation (Signed)
Physical Therapy Evaluation Patient Details Name: Stacey Henson MRN: [id number] DOB: 22-Jan-1956 Today's Date: 11/16/2020   History of Present Illness  Pt is a 64 yo female diagnosed with osteoarthritis of the right knee and is s/p elective R TKA.  PMH includes L TKA, headaches, hypothyroidism, interstitial cystitis, bladder surgery, and vertigo.    Clinical Impression  Pt was pleasant and motivated to participate during the session and performed very well during the session especially considering POD#0 status.  Pt did not require physical assistance with any functional task and was generally steady with transfers and gait needing just cuing for proper sequencing for pain control and safety.  Pt's SpO2 was 99-100% on room air during the session with HR WNL.  Pt reported no adverse symptoms during the session other than min to mod pain in the R knee.  Pt subjectively reported feeling that she is very far ahead of where she was this time after her L TKA and is expected to make very good progress while in acute care.  Pt will benefit from HHPT services upon discharge to safely address deficits listed in patient problem list for decreased caregiver assistance and eventual return to PLOF.      Follow Up Recommendations Home health PT;Supervision for mobility/OOB    Equipment Recommendations  None recommended by PT    Recommendations for Other Services       Precautions / Restrictions Precautions Precautions: Fall Restrictions Weight Bearing Restrictions: Yes RLE Weight Bearing: Weight bearing as tolerated      Mobility  Bed Mobility Overal bed mobility: Modified Independent             General bed mobility comments: Extra time and effort only, no physical assistance needed    Transfers Overall transfer level: Needs assistance Equipment used: Rolling walker (2 wheeled) Transfers: Sit to/from Stand Sit to Stand: Min guard         General transfer comment: Min  verbal cues for foot and hand positioning  Ambulation/Gait Ambulation/Gait assistance: Min guard Gait Distance (Feet): 4 Feet Assistive device: Rolling walker (2 wheeled) Gait Pattern/deviations: Step-to pattern;Antalgic;Decreased stance time - right;Decreased step length - left Gait velocity: decreased   General Gait Details: Pt able to take several steps at the EOB and then from the EOB to the chair with good control and stability with mod lean on the RW during RLE SLS  Stairs            Wheelchair Mobility    Modified Rankin (Stroke Patients Only)       Balance Overall balance assessment: Needs assistance   Sitting balance-Leahy Scale: Normal     Standing balance support: Bilateral upper extremity supported;During functional activity Standing balance-Leahy Scale: Good Standing balance comment: Min to mod lean on the RW for support but no LOB in standing                             Pertinent Vitals/Pain Pain Assessment: 0-10 Pain Score: 2  Pain Location: R knee Pain Descriptors / Indicators: Sore;Aching Pain Intervention(s): Premedicated before session;Monitored during session;Patient requesting pain meds-RN notified    Home Living Family/patient expects to be discharged to:: Private residence Living Arrangements: Alone Available Help at Discharge: Available 24 hours/day;Family Type of Home: House Home Access: Stairs to enter Entrance Stairs-Rails: Right;Left;Can reach both Entrance Stairs-Number of Steps: 5+3 Home Layout: One level Home Equipment: Walker - 4 wheels;Grab bars - toilet;Grab bars - tub/shower;Shower  seat;Walker - 2 wheels;Bedside commode      Prior Function Level of Independence: Independent         Comments: Ind Amb without an AD community distances, no recent fall history     Hand Dominance        Extremity/Trunk Assessment   Upper Extremity Assessment Upper Extremity Assessment: Overall WFL for tasks assessed     Lower Extremity Assessment Lower Extremity Assessment: RLE deficits/detail;LLE deficits/detail RLE Deficits / Details: Ind RLE SLR wihout extensor lag; BLE ankle AROM and strength WNL RLE: Unable to fully assess due to pain RLE Sensation: WNL LLE Deficits / Details: Strength WNL LLE Sensation: WNL       Communication   Communication: No difficulties  Cognition Arousal/Alertness: Awake/alert Behavior During Therapy: WFL for tasks assessed/performed Overall Cognitive Status: Within Functional Limits for tasks assessed                                        General Comments      Exercises Total Joint Exercises Ankle Circles/Pumps: AROM;Strengthening;Both;10 reps Quad Sets: AROM;Strengthening;Right;5 reps;10 reps Gluteal Sets: Strengthening;Both;10 reps Heel Slides: AROM;Right;10 reps Hip ABduction/ADduction: AROM;Right;5 reps;Strengthening Straight Leg Raises: AROM;Strengthening;Right;5 reps Long Arc Quad: AROM;Strengthening;Right;5 reps;10 reps Knee Flexion: AROM;Strengthening;Right;5 reps;10 reps Marching in Standing: AROM;Strengthening;Both;5 reps;Standing Other Exercises Other Exercises: Positioning education to promote L knee ext PROM Other Exercises: HEP eduction and review per handout   Assessment/Plan    PT Assessment Patient needs continued PT services  PT Problem List Decreased strength;Decreased range of motion;Decreased activity tolerance;Decreased balance;Decreased mobility;Decreased knowledge of use of DME;Pain       PT Treatment Interventions DME instruction;Gait training;Stair training;Functional mobility training;Therapeutic activities;Therapeutic exercise;Balance training;Patient/family education    PT Goals (Current goals can be found in the Care Plan section)  Acute Rehab PT Goals Patient Stated Goal: To be able to get up the stairs PT Goal Formulation: With patient Time For Goal Achievement: 11/29/20 Potential to Achieve Goals:  Good    Frequency BID   Barriers to discharge        Co-evaluation               AM-PAC PT "6 Clicks" Mobility  Outcome Measure Help needed turning from your back to your side while in a flat bed without using bedrails?: A Little Help needed moving from lying on your back to sitting on the side of a flat bed without using bedrails?: A Little Help needed moving to and from a bed to a chair (including a wheelchair)?: A Little Help needed standing up from a chair using your arms (e.g., wheelchair or bedside chair)?: A Little Help needed to walk in hospital room?: A Lot Help needed climbing 3-5 steps with a railing? : A Lot 6 Click Score: 16    End of Session Equipment Utilized During Treatment: Gait belt Activity Tolerance: Patient tolerated treatment well Patient left: in chair;with nursing/sitter in room;with call bell/phone within reach;with SCD's reapplied;Other (comment);with chair alarm set (Polar care donned to R knee) Nurse Communication: Mobility status;Weight bearing status PT Visit Diagnosis: Muscle weakness (generalized) (M62.81);Other abnormalities of gait and mobility (R26.89);Pain Pain - Right/Left: Right Pain - part of body: Knee    Time: 9833-8250 PT Time Calculation (min) (ACUTE ONLY): 36 min   Charges:   PT Evaluation $PT Eval Moderate Complexity: 1 Mod PT Treatments $Therapeutic Exercise: 8-22 mins  Ovidio Hanger PT, DPT 11/16/20, 5:34 PM

## 2020-11-16 NOTE — Op Note (Signed)
11/16/2020  2:35 PM  PATIENT:  Stacey Henson  64 y.o. female  PRE-OPERATIVE DIAGNOSIS:  Primary osteoarthritis of right knee M17.11  POST-OPERATIVE DIAGNOSIS:  Primary osteoarthritis of right knee M17.11  PROCEDURE:  Procedure(s) with comments: TOTAL KNEE ARTHROPLASTY (Right) - REQUESTING RNFA  SURGEON: Leitha Schuller, MD  ASSISTANTS: Sonda Rumble, RNFA  ANESTHESIA:   spinal  EBL:  Total I/O In: 1200 [I.V.:1000; IV Piggyback:200] Out: 400 [Urine:200; Blood:200]  BLOOD ADMINISTERED:none  DRAINS: Incisional wound VAC   LOCAL MEDICATIONS USED:  MARCAINE    and OTHER Exparel and Toradol  SPECIMEN:  No Specimen  DISPOSITION OF SPECIMEN:  N/A  COUNTS:  YES  TOURNIQUET:   Total Tourniquet Time Documented: Thigh (Right) - 80 minutes Total: Thigh (Right) - 80 minutes  IMPLANTS:Medacta GMK sphere system with  right 32femur,  right 2tibia with short stem and 16mm insert. Size 2patella, all components cemented.  DICTATION: Reubin Milan Dictationpatient was brought to the operating room and spinal anesthesia was obtained. After prepping and draping theright leg in sterile fashion, andafter patient identification and timeout procedures were completed, the leg was exsanguinated with Esmarch and tourniquet was raisedand midline skin incision was made followed by medial parapatellar arthrotomywith severemedial compartment osteoarthritis, severepatellofemoral arthritis and mild lateralcompartment arthritis,partial synovectomy was also carried out. TheACL andPCL and fat pad were excisedalong with anterior horns of themeniscus. The proximal tibia cutting guide from the [facility] was appliedand theproximal tibia cut carried out. The distal femoral cut was carried out in a similar fashion  The 68femoralcutting guide applied with anterior posterior and chamfer cuts made.The posterior horns of the menisci were removed at this  point.Injection of the above medication was carried out after the femoral and tibial cuts were carried out. The 2baseplate trial was placed pinned into position and proximal tibial preparation carried out with drilling hand reaming and the keel punch followed by placement of the83femur and sizing the tibial insert, size the best fit with stability and full extension. The distal femoral drill holes were made in the notch cut for the trochlear groove was then carried outwith trials were then removed the patella was cut using the patellar cutting guide and it sized to a size 2 afterdrill holes have been made The knee was irrigated with pulsatile lavage and the bony surfaces dried the tibial component was cemented into place first. Excess cement was removed and the polyethylene insert placed with a torque screw placed with a torque screwdriver tightened. The distal femoral component was placed and the knee was held in extension as the patellar button was clamped into place. After the cement was set,excess cement was removed and the knee was again irrigated thoroughly thoroughly irrigated. The tourniquet was let down and hemostasis checkedwithelectrocautery.  There was a tight lateral retinaculum and a partial lateral release was carried out from the outside not going to the synovium.  The arthrotomy was repaired with a heavy Quill suture, followed by 3-0 V lock subcuticular closure,skin staplesfollowed byincisional wound VAC and Polar Care.Marland Kitchen   PLAN OF CARE: Admit to inpatient   PATIENT DISPOSITION:  PACU - hemodynamically stable.

## 2020-11-16 NOTE — Transfer of Care (Signed)
Immediate Anesthesia Transfer of Care Note  Patient: Stacey Henson  Procedure(s) Performed: TOTAL KNEE ARTHROPLASTY (Right Knee)  Patient Location: PACU  Anesthesia Type:spinal  Level of Consciousness: sedated  Airway & Oxygen Therapy: Patient Spontanous Breathing  Post-op Assessment: Report given to RN and Post -op Vital signs reviewed and stable  Post vital signs: Reviewed and stable  Last Vitals:  Vitals Value Taken Time  BP 128/68 11/16/20 1434  Temp    Pulse 95 11/16/20 1437  Resp 16 11/16/20 1437  SpO2 99 % 11/16/20 1437  Vitals shown include unvalidated device data.  Last Pain:  Vitals:   11/16/20 1046  TempSrc: Oral  PainSc: 0-No pain         Complications: No complications documented.

## 2020-11-17 LAB — CBC
HCT: 31.9 % — ABNORMAL LOW (ref 36.0–46.0)
Hemoglobin: 10.4 g/dL — ABNORMAL LOW (ref 12.0–15.0)
MCH: 28.2 pg (ref 26.0–34.0)
MCHC: 32.6 g/dL (ref 30.0–36.0)
MCV: 86.4 fL (ref 80.0–100.0)
Platelets: 224 10*3/uL (ref 150–400)
RBC: 3.69 MIL/uL — ABNORMAL LOW (ref 3.87–5.11)
RDW: 14.1 % (ref 11.5–15.5)
WBC: 6.4 10*3/uL (ref 4.0–10.5)
nRBC: 0.8 % — ABNORMAL HIGH (ref 0.0–0.2)

## 2020-11-17 LAB — BASIC METABOLIC PANEL
Anion gap: 9 (ref 5–15)
BUN: 11 mg/dL (ref 8–23)
CO2: 25 mmol/L (ref 22–32)
Calcium: 8.8 mg/dL — ABNORMAL LOW (ref 8.9–10.3)
Chloride: 103 mmol/L (ref 98–111)
Creatinine, Ser: 0.86 mg/dL (ref 0.44–1.00)
GFR, Estimated: >60 mL/min (ref >60)
Glucose, Bld: 135 mg/dL — ABNORMAL HIGH (ref 70–99)
Potassium: 4.3 mmol/L (ref 3.5–5.1)
Sodium: 137 mmol/L (ref 135–145)

## 2020-11-17 MED ORDER — MAGNESIUM HYDROXIDE 400 MG/5ML PO SUSP
30.0000 mL | Freq: Once | ORAL | Status: AC
  Administered 2020-11-17: 30 mL via ORAL
  Filled 2020-11-17: qty 30

## 2020-11-17 MED ORDER — METHOCARBAMOL 500 MG PO TABS
750.0000 mg | ORAL_TABLET | Freq: Four times a day (QID) | ORAL | Status: DC | PRN
  Administered 2020-11-17 – 2020-11-19 (×6): 750 mg via ORAL
  Filled 2020-11-17 (×6): qty 2

## 2020-11-17 NOTE — Anesthesia Postprocedure Evaluation (Signed)
Anesthesia Post Note  Patient: Stacey Henson  Procedure(s) Performed: TOTAL KNEE ARTHROPLASTY (Right Knee)  Patient location during evaluation: Nursing Unit Anesthesia Type: Spinal Level of consciousness: oriented and awake and alert Pain management: pain level controlled Vital Signs Assessment: post-procedure vital signs reviewed and stable Respiratory status: spontaneous breathing and respiratory function stable Cardiovascular status: stable Postop Assessment: no headache, no backache, no apparent nausea or vomiting and patient able to bend at knees Anesthetic complications: no   No complications documented.   Last Vitals:  Vitals:   11/17/20 0355 11/17/20 0814  BP: 137/69 (!) 113/41  Pulse: 98 91  Resp: 17 16  Temp: 36.8 C 36.8 C  SpO2: 98% 99%    Last Pain:  Vitals:   11/17/20 0758  TempSrc:   PainSc: 0-No pain                 Tamiki Kuba K

## 2020-11-17 NOTE — Progress Notes (Addendum)
Physical Therapy Treatment Patient Details Name: Stacey Henson MRN: [id number] DOB: [birth year] Today's Date: 11/17/2020    History of Present Illness Pt is a 64 yo female diagnosed with osteoarthritis of the right knee and is s/p elective R TKA.  PMH includes L TKA, headaches, hypothyroidism, interstitial cystitis, bladder surgery, and vertigo.    PT Comments    Pt was long sitting in bed upon arriving. She agrees to PT session and is cooperative and motivated throughout. Session greatly limited by pt becoming nauseous during gait training. She required increased time + assistance to exit  Bed today due to having more severe pain. She was able to stand to RW with CGA from slightly elevated bed height prior to ambulating ~ 87ft. Chair had to be brought behind pt due to fatigue/nausea. She was in recliner at conclusion of session with towel roll in place to promote knee ext, call bell in hand, polar care applied and chair alarm in place.Pt states she does have some assistance intermittently at home and is planning to DC home with [facility] services. Acute PT will continue to progress pt as able per POC. Will perform ROM and there ex in PM session.    Follow Up Recommendations  Home health PT;Supervision for mobility/OOB     Equipment Recommendations  None recommended by PT    Recommendations for Other Services       Precautions / Restrictions Precautions Precautions: Fall Precaution Booklet Issued: Yes (comment) Restrictions Weight Bearing Restrictions: Yes RLE Weight Bearing: Weight bearing as tolerated    Mobility  Bed Mobility Overal bed mobility: Needs Assistance Bed Mobility: Supine to Sit     Supine to sit: Min assist;HOB elevated     General bed mobility comments: Min A for R LE  Transfers Overall transfer level: Needs assistance Equipment used: Rolling walker (2 wheeled) Transfers: Sit to/from Stand Sit to Stand: Min guard             Ambulation/Gait Ambulation/Gait assistance: Min Emergency planning/management officer (Feet): 75 Feet Assistive device: Rolling walker (2 wheeled) Gait Pattern/deviations: Step-to pattern;Antalgic;Decreased stance time - right;Decreased step length - left;Trunk flexed Gait velocity: decreased   General Gait Details: Pt was able to stand to RW and ambulate 75 ft with RW with antalgic step to pattern. pt was limited by fatigue/nausea. Needed to have chair brought behind her 2/2 to becoming nausous       Balance Overall balance assessment: Needs assistance Sitting-balance support: Feet supported Sitting balance-Leahy Scale: Good     Standing balance support: Bilateral upper extremity supported;During functional activity Standing balance-Leahy Scale: Good Standing balance comment: very dependent on RW. leans on elbows            Cognition Arousal/Alertness: Awake/alert Behavior During Therapy: WFL for tasks assessed/performed Overall Cognitive Status: Within Functional Limits for tasks assessed        General Comments: pt is A and O x 4. agreeable to session and cooperative throughout             Pertinent Vitals/Pain Pain Assessment: No/denies pain Pain Score: 6  Pain Location: R knee Pain Descriptors / Indicators: Sore;Aching Pain Intervention(s): Limited activity within patient's tolerance;Monitored during session;Premedicated before session;Repositioned;Ice applied    Home Living Family/patient expects to be discharged to:: Private residence Living Arrangements: Alone Available Help at Discharge: Available 24 hours/day;Family Type of Home: House Home Access: Stairs to enter Entrance Stairs-Rails: Right;Left;Can reach both Home Layout: One level Home Equipment: Walker - 4 wheels;Grab bars -  toilet;Grab bars - tub/shower;Shower seat;Walker - 2 wheels;Bedside commode      Prior Function Level of Independence: Independent      Comments: Ind Amb without an AD  community distances, no recent fall history   PT Goals (current goals can now be found in the care plan section) Acute Rehab PT Goals Patient Stated Goal: go home Progress towards PT goals: Progressing toward goals    Frequency    BID      PT Plan Current plan remains appropriate       AM-PAC PT "6 Clicks" Mobility   Outcome Measure  Help needed turning from your back to your side while in a flat bed without using bedrails?: A Little Help needed moving from lying on your back to sitting on the side of a flat bed without using bedrails?: A Little Help needed moving to and from a bed to a chair (including a wheelchair)?: A Little Help needed standing up from a chair using your arms (e.g., wheelchair or bedside chair)?: A Little Help needed to walk in hospital room?: A Lot Help needed climbing 3-5 steps with a railing? : A Lot 6 Click Score: 16    End of Session Equipment Utilized During Treatment: Gait belt Activity Tolerance: Patient tolerated treatment well Patient left: in chair;with nursing/sitter in room;with call bell/phone within reach;with SCD's reapplied;Other (comment);with chair alarm set Nurse Communication: Mobility status;Weight bearing status PT Visit Diagnosis: Muscle weakness (generalized) (M62.81);Other abnormalities of gait and mobility (R26.89);Pain Pain - Right/Left: Right Pain - part of body: Knee     Time: 4196-2229 PT Time Calculation (min) (ACUTE ONLY): 26 min  Charges:  $Gait Training: 8-22 mins $Therapeutic Activity: 8-22 mins                     Jetta Lout PTA 11/17/20, 1:20 PM

## 2020-11-17 NOTE — Evaluation (Signed)
Occupational Therapy Evaluation Patient Details Name: Stacey Henson MRN: [id number] DOB: 1956/04/08 Today's Date: 11/17/2020    History of Present Illness Pt is a 64 yo female diagnosed with osteoarthritis of the right knee and is s/p elective R TKA.  PMH includes L TKA, headaches, hypothyroidism, interstitial cystitis, bladder surgery, and vertigo.   Clinical Impression   Patient presenting with decreased I in self care, balance , functional transfer/mobility, endurance, strength, and safety awareness.  Patient reports being independent PTA. She has all needed equipment including reacher to increase I with LB clothing management.  Patient currently functioning at min guard - min A overall with use of RW for self care tasks and functional transfers. OT briefly reviewed polar care although pt is familiar with it. Patient will benefit from acute OT to increase overall independence in the areas of ADLs, functional mobility, and safety awareness in order to safely discharge home with family.     Follow Up Recommendations  No OT follow up;Supervision - Intermittent    Equipment Recommendations  None recommended by OT    Recommendations for Other Services Other (comment) (none at this time)     Precautions / Restrictions Precautions Precautions: Fall Precaution Booklet Issued: Yes (comment) Restrictions Weight Bearing Restrictions: Yes RLE Weight Bearing: Weight bearing as tolerated      Mobility Bed Mobility Overal bed mobility: Needs Assistance Bed Mobility: Sit to Supine     Supine to sit: Min assist;HOB elevated     General bed mobility comments: Min A for R LE    Transfers Overall transfer level: Needs assistance Equipment used: Rolling walker (2 wheeled) Transfers: Sit to/from Stand Sit to Stand: Min guard         Balance Overall balance assessment: Needs assistance   Sitting balance-Leahy Scale: Normal     Standing balance support: Bilateral upper  extremity supported;During functional activity Standing balance-Leahy Scale: Good Standing balance comment: reliance on RW for UE support          ADL either performed or assessed with clinical judgement   ADL Overall ADL's : Needs assistance/impaired     Grooming: Wash/dry hands;Wash/dry face;Sitting;Set up           Upper Body Dressing : Minimal assistance;Standing Upper Body Dressing Details (indicate cue type and reason): donning/doffing house coat in standing Lower Body Dressing: Minimal assistance         Vision Patient Visual Report: No change from baseline              Pertinent Vitals/Pain Pain Assessment: 0-10 Pain Score: 7  Pain Location: R knee Pain Descriptors / Indicators: Sore;Aching Pain Intervention(s): Limited activity within patient's tolerance;Monitored during session;Premedicated before session     Hand Dominance Right   Extremity/Trunk Assessment Upper Extremity Assessment Upper Extremity Assessment: Overall WFL for tasks assessed   Lower Extremity Assessment Lower Extremity Assessment: Defer to PT evaluation       Communication Communication Communication: No difficulties   Cognition Arousal/Alertness: Awake/alert Behavior During Therapy: WFL for tasks assessed/performed Overall Cognitive Status: Within Functional Limits for tasks assessed     General Comments: pt is A and O x 4. agreeable to session and cooperative throughout              Home Living Family/patient expects to be discharged to:: Private residence Living Arrangements: Alone Available Help at Discharge: Available 24 hours/day;Family Type of Home: House Home Access: Stairs to enter [employer] of Steps: 5+3 Entrance Stairs-Rails: Right;Left;Can reach both Home  Layout: One level     Bathroom Shower/Tub: Walk-in shower         Home Equipment: Walker - 4 wheels;Grab bars - toilet;Grab bars - tub/shower;Shower seat;Walker - 2 wheels;Bedside  commode          Prior Functioning/Environment Level of Independence: Independent        Comments: Ind Amb without an AD community distances, no recent fall history        OT Problem List: Pain;Decreased range of motion;Decreased activity tolerance;Decreased safety awareness;Impaired balance (sitting and/or standing);Decreased knowledge of use of DME or AE;Decreased knowledge of precautions      OT Treatment/Interventions: Self-care/ADL training;Therapeutic exercise;Therapeutic activities;Energy conservation;DME and/or AE instruction;Patient/family education;Balance training    OT Goals(Current goals can be found in the care plan section) Acute Rehab OT Goals Patient Stated Goal: go home OT Goal Formulation: With patient Time For Goal Achievement: 12/01/20 Potential to Achieve Goals: Good  OT Frequency: Min 1X/week   Barriers to D/C: Other (comment)  none at this time          AM-PAC OT "6 Clicks" Daily Activity     Outcome Measure Help from another person eating meals?: None Help from another person taking care of personal grooming?: None Help from another person toileting, which includes using toliet, bedpan, or urinal?: A Lot Help from another person bathing (including washing, rinsing, drying)?: A Lot Help from another person to put on and taking off regular upper body clothing?: None Help from another person to put on and taking off regular lower body clothing?: A Lot 6 Click Score: 18   End of Session Equipment Utilized During Treatment: Rolling walker;Other (comment) (wound vac)  Activity Tolerance: Patient tolerated treatment well Patient left: in bed;with call bell/phone within reach;with bed alarm set                   Time: 1048-1110 OT Time Calculation (min): 22 min Charges:  OT General Charges $OT Visit: 1 Visit OT Evaluation $OT Eval Low Complexity: 1 Low OT Treatments $Self Care/Home Management : 8-22 mins  Jackquline Denmark, MS, OTR/L ,  CBIS ascom [phone]  11/17/20, 12:45 PM

## 2020-11-17 NOTE — Progress Notes (Signed)
° °  Subjective: 1 Day Post-Op Procedure(s) (LRB): TOTAL KNEE ARTHROPLASTY (Right) Patient reports pain as moderate.   Patient is well, and has had no acute complaints or problems.  She is complaining a little bit of spasms behind the knee.  No calf pain. Denies any CP, SOB, ABD pain. We will continue therapy today.  Plan is to go Home after hospital stay.  Objective: Vital signs in last 24 hours: Temp:  [97.6 F (36.4 C)-98.7 F (37.1 C)] 98.3 F (36.8 C) (11/20 0355) Pulse Rate:  [73-98] 98 (11/20 0355) Resp:  [16-20] 17 (11/20 0355) BP: (105-155)/(53-104) 137/69 (11/20 0355) SpO2:  [97 %-100 %] 98 % (11/20 0355) Weight:  [102 kg] 102 kg (11/19 1046)  Intake/Output from previous day: 11/19 0701 - 11/20 0700 In: 1523.8 [I.V.:1323.8; IV Piggyback:200] Out: 1150 [Urine:700; Drains:250; Blood:200] Intake/Output this shift: No intake/output data recorded.  Recent Labs    11/16/20 1710 11/17/20 0644  HGB 11.8* 10.4*   Recent Labs    11/16/20 1710 11/17/20 0644  WBC 9.2 6.4  RBC 4.22 3.69*  HCT 36.5 31.9*  PLT 258 224   Recent Labs    11/16/20 1710 11/17/20 0644  NA  --  137  K  --  4.3  CL  --  103  CO2  --  25  BUN  --  11  CREATININE 0.64 0.86  GLUCOSE  --  135*  CALCIUM  --  8.8*   No results for input(s): LABPT, INR in the last 72 hours.  EXAM General - Patient is Alert, Appropriate and Oriented Extremity - Neurologically intact Neurovascular intact Sensation intact distally Intact pulses distally No cellulitis present Compartment soft Dressing - dressing C/D/I and no drainage, Praveena intact without drainage Motor Function - intact, moving foot and toes well on exam.   Past Medical History:  Diagnosis Date   Arthritis    Bladder spasms    Complication of anesthesia    Headache    H/O MIGRAINES   Hypothyroidism    IC (interstitial cystitis)    Lipidemia    PONV (postoperative nausea and vomiting)    X 2   Vertigo    DUE TO  ALLERGIES    Assessment/Plan:   1 Day Post-Op Procedure(s) (LRB): TOTAL KNEE ARTHROPLASTY (Right) Active Problems:   S/P TKR (total knee replacement) using cement, right  Estimated body mass index is 43.92 kg/m as calculated from the following:   Height as of this encounter: 5' (1.524 m).   Weight as of this encounter: 102 kg. Advance diet Up with therapy  Work on bowel movement Start Robaxin every 6 hours as needed Labs and vital signs are stable Pain well controlled Care manager to assist with discharge to home with home health PT likely Sunday or Monday pending her progress with PT  DVT Prophylaxis - Lovenox, TED hose and SCDs Weight-Bearing as tolerated to right leg   T. Cranston Neighbor, PA-C [facility] 11/17/2020, 7:56 AM

## 2020-11-17 NOTE — Progress Notes (Signed)
Physical Therapy Treatment Patient Details Name: Stacey Henson MRN: [id number] DOB: [birth year] Today's Date: 11/17/2020    History of Present Illness Pt is a 64 yo female diagnosed with osteoarthritis of the right knee and is s/p elective R TKA.  PMH includes L TKA, headaches, hypothyroidism, interstitial cystitis, bladder surgery, and vertigo.    PT Comments    Pt was supine in bed upon arriving. She reports having much more severe pain with burning anteriorly and throbbing pain  posteriorly. Unwilling to perform OOB activity this session, however agreeable to there ex in bed. See exercises listed below. Overall pt is progressing but will need to do well with PT session in near future to feel good/safe about DC to home with HHPT to follow. Acute PT will return in morning and progress as able per pt tolerance and per POC. Polar care was reapplied and towel roll placed under heel for ext.    Follow Up Recommendations  Home health PT;Supervision for mobility/OOB;Supervision/Assistance - 24 hour     Equipment Recommendations  None recommended by PT    Recommendations for Other Services       Precautions / Restrictions Precautions Precautions: Fall Precaution Booklet Issued: Yes (comment) Restrictions Weight Bearing Restrictions: Yes RLE Weight Bearing: Weight bearing as tolerated    Mobility  Bed Mobility Overal bed mobility: Needs Assistance Bed Mobility: Supine to Sit     Supine to sit: Min assist;HOB elevated     General bed mobility comments: pt unwilling to get OOB. Only agreeable to bed level ther ex 2/2 to pain.  Transfers Overall transfer level: Needs assistance Equipment used: Rolling walker (2 wheeled) Transfers: Sit to/from Stand Sit to Stand: Min guard            Ambulation/Gait Ambulation/Gait assistance: Min Emergency planning/management officer (Feet): 75 Feet Assistive device: Rolling walker (2 wheeled) Gait Pattern/deviations: Step-to  pattern;Antalgic;Decreased stance time - right;Decreased step length - left;Trunk flexed Gait velocity: decreased   General Gait Details: Pt was able to stand to RW and ambulate 75 ft with RW with antalgic step to pattern. pt was limited by fatigue/nausea. Needed to have chair brought behind her 2/2 to becoming nausous       Balance Overall balance assessment: Needs assistance Sitting-balance support: Feet supported Sitting balance-Leahy Scale: Good     Standing balance support: Bilateral upper extremity supported;During functional activity Standing balance-Leahy Scale: Good Standing balance comment: very dependent on RW. leans on elbows        Cognition Arousal/Alertness: Awake/alert Behavior During Therapy: WFL for tasks assessed/performed Overall Cognitive Status: Within Functional Limits for tasks assessed        General Comments: pt is A and O x 4. agreeable to session and cooperative throughout      Exercises Total Joint Exercises Ankle Circles/Pumps: AROM;Strengthening;Both;10 reps Quad Sets: AROM;Strengthening;Right;5 reps;10 reps Gluteal Sets: Strengthening;Both;10 reps Heel Slides: AROM;Right;10 reps Hip ABduction/ADduction: AAROM;10 reps Straight Leg Raises: AAROM;10 reps Goniometric ROM:  (pt unwilling to participate in ROM/measuremen this afternoon)        Pertinent Vitals/Pain Pain Assessment: 0-10 Pain Score: 8  Pain Location: R knee Pain Descriptors / Indicators: Sore;Aching Pain Intervention(s): Limited activity within patient's tolerance;Monitored during session;Premedicated before session;Repositioned;Ice applied           PT Goals (current goals can now be found in the care plan section) Acute Rehab PT Goals Patient Stated Goal: go home Progress towards PT goals: Not progressing toward goals - comment (pain limiting session progression)  Frequency    BID      PT Plan Current plan remains appropriate       AM-PAC PT "6 Clicks"  Mobility   Outcome Measure  Help needed turning from your back to your side while in a flat bed without using bedrails?: A Little Help needed moving from lying on your back to sitting on the side of a flat bed without using bedrails?: A Little Help needed moving to and from a bed to a chair (including a wheelchair)?: A Little Help needed standing up from a chair using your arms (e.g., wheelchair or bedside chair)?: A Little Help needed to walk in hospital room?: A Little Help needed climbing 3-5 steps with a railing? : A Lot 6 Click Score: 17    End of Session Equipment Utilized During Treatment: Gait belt Activity Tolerance: Patient limited by pain Patient left: in bed;with call bell/phone within reach;with bed alarm set;with SCD's reapplied;with nursing/sitter in room Nurse Communication: Mobility status;Weight bearing status PT Visit Diagnosis: Muscle weakness (generalized) (M62.81);Other abnormalities of gait and mobility (R26.89);Pain Pain - Right/Left: Right Pain - part of body: Knee     Time: 1420-1430 PT Time Calculation (min) (ACUTE ONLY): 10 min  Charges:  $Gait Training: 8-22 mins $Therapeutic Exercise: 8-22 mins $Therapeutic Activity: 8-22 mins                     Jetta Lout PTA 11/17/20, 4:37 PM

## 2020-11-17 NOTE — Progress Notes (Signed)
   11/17/20 0930  Clinical Encounter Type  Visited With Patient  Visit Type Initial;Psychological support;Spiritual support  Referral From Nurse  Consult/Referral To Chaplain  ?Ch dis an AD education with Pt. Ch will follow up Later.

## 2020-11-18 MED ORDER — OXYCODONE HCL 5 MG PO TABS
5.0000 mg | ORAL_TABLET | ORAL | 0 refills | Status: DC | PRN
Start: 2020-11-18 — End: 2024-01-23

## 2020-11-18 MED ORDER — SODIUM CHLORIDE 0.9 % IV BOLUS
1000.0000 mL | Freq: Once | INTRAVENOUS | Status: AC
  Administered 2020-11-18: 1000 mL via INTRAVENOUS

## 2020-11-18 MED ORDER — TRAMADOL HCL 50 MG PO TABS: 50.0000 mg | ORAL_TABLET | Freq: Four times a day (QID) | ORAL | 0 refills | Status: DC

## 2020-11-18 MED ORDER — ENOXAPARIN SODIUM 40 MG/0.4ML ~~LOC~~ SOLN: 40.0000 mg | SUBCUTANEOUS | 0 refills | Status: DC

## 2020-11-18 MED ORDER — METHOCARBAMOL 750 MG PO TABS: 750.0000 mg | ORAL_TABLET | Freq: Four times a day (QID) | ORAL | 0 refills | Status: AC | PRN

## 2020-11-18 MED ORDER — DOCUSATE SODIUM 100 MG PO CAPS: 100.0000 mg | ORAL_CAPSULE | Freq: Two times a day (BID) | ORAL | 0 refills | Status: DC

## 2020-11-18 NOTE — Progress Notes (Signed)
Physical Therapy Treatment Patient Details Name: Stacey Henson MRN: [id number] DOB: February 23, 1956 Today's Date: 11/18/2020    History of Present Illness Pt is a 64 yo female diagnosed with osteoarthritis of the right knee and is s/p elective R TKA.  PMH includes L TKA, headaches, hypothyroidism, interstitial cystitis, bladder surgery, and vertigo.    PT Comments    Pt stated she had pain medication prior to session.  Participated in exercises as described below.  To EOB with mod a x 1 for LE and upper body management.  Once sitting EOB RN in with pain medication.  8/10.  "I thought I took it."  Medication received.  Pt wanted to continue wit session.  Stood with mod a x 1 and generally poor transfer with increased time to get upright.  Once standing she is able to walk 15' in room before c/o feeling dizzy.  Called out for assist and chair was brought behind pt by RN.  BP taken 92/77 P 75 O2 98%.  Dizziness remains.  She is reclined in recliner and needs in reach.  Further gait deferred at this time.   Follow Up Recommendations  Home health PT;Supervision for mobility/OOB;Supervision/Assistance - 24 hour     Equipment Recommendations  None recommended by PT    Recommendations for Other Services       Precautions / Restrictions Precautions Precautions: Fall Precaution Booklet Issued: Yes (comment) Restrictions Weight Bearing Restrictions: Yes RLE Weight Bearing: Weight bearing as tolerated    Mobility  Bed Mobility Overal bed mobility: Needs Assistance Bed Mobility: Supine to Sit     Supine to sit: Mod assist;HOB elevated        Transfers Overall transfer level: Needs assistance Equipment used: Rolling walker (2 wheeled) Transfers: Sit to/from Stand Sit to Stand: Min assist;Mod assist         General transfer comment: cues to push from bed and to stand upright  Ambulation/Gait Ambulation/Gait assistance: Min guard Gait Distance (Feet): 15 Feet Assistive  device: Rolling walker (2 wheeled) Gait Pattern/deviations: Step-to pattern;Antalgic;Decreased stance time - right;Decreased step length - left;Trunk flexed Gait velocity: decreased   General Gait Details: limited by dizziness   Stairs             Wheelchair Mobility    Modified Rankin (Stroke Patients Only)       Balance Overall balance assessment: Needs assistance Sitting-balance support: Feet supported Sitting balance-Leahy Scale: Good     Standing balance support: Bilateral upper extremity supported;During functional activity Standing balance-Leahy Scale: Good Standing balance comment: very dependent on RW. leans on elbows                             Cognition Arousal/Alertness: Awake/alert Behavior During Therapy: WFL for tasks assessed/performed Overall Cognitive Status: Within Functional Limits for tasks assessed                                        Exercises Total Joint Exercises Ankle Circles/Pumps: AROM;Strengthening;Both;10 reps Quad Sets: AROM;Strengthening;Right;5 reps;10 reps Gluteal Sets: Strengthening;Both;10 reps Heel Slides: AROM;Right;10 reps Hip ABduction/ADduction: AAROM;10 reps Straight Leg Raises: AAROM;10 reps Goniometric ROM: 0-65 - self limited    General Comments        Pertinent Vitals/Pain Pain Assessment: 0-10 Pain Score: 7  Pain Location: R knee Pain Descriptors / Indicators: Sore;Aching Pain Intervention(s): Limited activity  within patient's tolerance;Monitored during session;Repositioned;RN gave pain meds during session;Ice applied    Home Living                      Prior Function            PT Goals (current goals can now be found in the care plan section) Progress towards PT goals: Not progressing toward goals - comment    Frequency    BID      PT Plan Current plan remains appropriate    Co-evaluation              AM-PAC PT "6 Clicks" Mobility   Outcome  Measure  Help needed turning from your back to your side while in a flat bed without using bedrails?: A Lot Help needed moving from lying on your back to sitting on the side of a flat bed without using bedrails?: A Lot Help needed moving to and from a bed to a chair (including a wheelchair)?: A Little Help needed standing up from a chair using your arms (e.g., wheelchair or bedside chair)?: A Little Help needed to walk in hospital room?: A Little Help needed climbing 3-5 steps with a railing? : A Lot 6 Click Score: 15    End of Session Equipment Utilized During Treatment: Gait belt Activity Tolerance: Patient limited by pain Patient left: in bed;with call bell/phone within reach;with bed alarm set;with SCD's reapplied;with nursing/sitter in room Nurse Communication: Mobility status;Weight bearing status PT Visit Diagnosis: Muscle weakness (generalized) (M62.81);Other abnormalities of gait and mobility (R26.89);Pain Pain - Right/Left: Right Pain - part of body: Knee     Time: 6384-6659 PT Time Calculation (min) (ACUTE ONLY): 30 min  Charges:  $Gait Training: 8-22 mins $Therapeutic Exercise: 8-22 mins                    Danielle Dess, PTA 11/18/20, 9:30 AM

## 2020-11-18 NOTE — Discharge Instructions (Signed)

## 2020-11-18 NOTE — Discharge Summary (Signed)
Physician Discharge Summary  Patient ID: Stacey Henson MRN: [id number] DOB/AGE: November 13, 1956 64 y.o.  Admit date: 11/16/2020 Discharge date: 11/20/2020  Admission Diagnoses:  S/P TKR (total knee replacement) using cement, right [Z96.651]  Discharge Diagnoses: Patient Active Problem List   Diagnosis Date Noted  . S/P TKR (total knee replacement) using cement, right 11/16/2020  . Knee joint replacement status, left 05/24/2020    Past Medical History:  Diagnosis Date  . Arthritis   . Bladder spasms   . Complication of anesthesia   . Headache    H/O MIGRAINES  . Hypothyroidism   . IC (interstitial cystitis)   . Lipidemia   . PONV (postoperative nausea and vomiting)    X 2  . Vertigo    DUE TO ALLERGIES     Transfusion: None   Consultants (if any):   Discharged Condition: Improved  Hospital Course: Stacey Henson is an 64 y.o. female who was admitted 11/16/2020 with a diagnosis of right knee osteoarthritis and went to the operating room on 11/16/2020 and underwent the above named procedures.    Surgeries: Procedure(s): TOTAL KNEE ARTHROPLASTY on 11/16/2020 Patient tolerated the surgery well. Taken to PACU where she was stabilized and then transferred to the orthopedic floor.  Started on Lovenox 30 mg q 12 hrs. Foot pumps applied bilaterally at 80 mm. Heels elevated on bed with rolled towels. No evidence of DVT. Negative Homan. Physical therapy started on day #1 for gait training and transfer. OT started day #1 for ADL and assisted devices.  Patient's foley was d/c on day #1. Patient's IVwas d/c on day #2.  On post op day #4 patient was stable and ready for discharge to home with home health PT.  Implants: Medacta GMK sphere system with right 33femur, right 2tibia with short stem and20mm insert. Size2patella, all components cemented.  She was given perioperative antibiotics:  Anti-infectives (From admission, onward)   Start      Dose/Rate Route Frequency Ordered Stop   11/16/20 1830  ceFAZolin (ANCEF) IVPB 2g/100 mL premix        2 g 200 mL/hr over 30 Minutes Intravenous Every 6 hours 11/16/20 1515 11/17/20 0021   11/16/20 1025  ceFAZolin (ANCEF) 2-4 GM/100ML-% IVPB       Note to Pharmacy: Mike Craze   : cabinet override      11/16/20 1025 11/16/20 1247   11/16/20 0600  ceFAZolin (ANCEF) IVPB 2g/100 mL premix        2 g 200 mL/hr over 30 Minutes Intravenous On call to O.R. 11/15/20 2231 11/16/20 1250    .  She was given sequential compression devices, early ambulation, and Lovenox, teds for DVT prophylaxis.  She benefited maximally from the hospital stay and there were no complications.    Recent vital signs:  Vitals:   11/19/20 2332 11/20/20 0509  BP: 118/70 111/61  Pulse: 99 92  Resp: 16 16  Temp: 98.5 F (36.9 C) 98.6 F (37 C)  SpO2: 97% 100%    Recent laboratory studies:  Lab Results  Component Value Date   HGB 10.4 (L) 11/17/2020   HGB 11.8 (L) 11/16/2020   HGB 12.8 11/05/2020   Lab Results  Component Value Date   WBC 6.4 11/17/2020   PLT 224 11/17/2020   No results found for: INR Lab Results  Component Value Date   NA 137 11/17/2020   K 4.3 11/17/2020   CL 103 11/17/2020   CO2 25 11/17/2020   BUN 11 11/17/2020  CREATININE 0.86 11/17/2020   GLUCOSE 135 (H) 11/17/2020    Discharge Medications:   Allergies as of 11/20/2020      Reactions   Morphine And Related Nausea And Vomiting   Projectile vomitting      Medication List    TAKE these medications   acetaminophen 500 MG tablet Commonly known as: TYLENOL Take 500-1,000 mg by mouth every 6 (six) hours as needed (for pain/knee pain.).   amitriptyline 50 MG tablet Commonly known as: ELAVIL Take 50 mg by mouth at bedtime.   atorvastatin 40 MG tablet Commonly known as: LIPITOR Take 40 mg by mouth daily.   AZO Cranberry Urinary Tract 250-60 MG Caps Generic drug: Cranberry-Vitamin C Take 2 capsules by mouth  daily.   cyclobenzaprine 5 MG tablet Commonly known as: FLEXERIL Take 5 mg by mouth 2 (two) times daily as needed for muscle spasms.   diphenhydrAMINE 25 MG tablet Commonly known as: BENADRYL Take 25 mg by mouth 3 (three) times daily as needed for allergies.   diphenhydramine-acetaminophen 25-500 MG Tabs tablet Commonly known as: TYLENOL PM Take 1 tablet by mouth at bedtime as needed (pain/sleep).   docusate sodium 100 MG capsule Commonly known as: COLACE Take 1 capsule (100 mg total) by mouth 2 (two) times daily.   enoxaparin 40 MG/0.4ML injection Commonly known as: Lovenox Inject 0.4 mLs (40 mg total) into the skin daily for 14 days.   levothyroxine 50 MCG tablet Commonly known as: SYNTHROID Take 50 mcg by mouth daily before breakfast.   methocarbamol 750 MG tablet Commonly known as: ROBAXIN Take 1 tablet (750 mg total) by mouth every 6 (six) hours as needed for muscle spasms.   multivitamin with minerals Tabs tablet Take 1 tablet by mouth daily. Women's One-A-Day   oxyCODONE 5 MG immediate release tablet Commonly known as: Oxy IR/ROXICODONE Take 1-2 tablets (5-10 mg total) by mouth every 4 (four) hours as needed for moderate pain (pain score 4-6).   Potassium 99 MG Tabs Take 99 mg by mouth at bedtime.   traMADol 50 MG tablet Commonly known as: ULTRAM Take 1 tablet (50 mg total) by mouth every 6 (six) hours.   Vitamin D3 125 MCG (5000 UT) Tabs Take 5,000 Units by mouth daily.   zolpidem 10 MG tablet Commonly known as: AMBIEN Take 10 mg by mouth at bedtime.            Durable Medical Equipment  (From admission, onward)         Start     Ordered   11/16/20 1516  DME Walker rolling  Once       Question Answer Comment  Walker: With 5 Inch Wheels   Patient needs a walker to treat with the following condition S/P TKR (total knee replacement) using cement, right      11/16/20 1515   11/16/20 1516  DME 3 n 1  Once        11/16/20 1515   11/16/20 1516   DME Bedside commode  Once       Question:  Patient needs a bedside commode to treat with the following condition  Answer:  S/P TKR (total knee replacement) using cement, right   11/16/20 1515          Diagnostic Studies: DG Knee 1-2 Views Right  Result Date: 11/16/2020 CLINICAL DATA:  Postop right knee pain following arthroplasty. EXAM: RIGHT KNEE - 1-2 VIEW COMPARISON:  Right knee CT 09/24/2020 FINDINGS: Sequelae of interval total knee arthroplasty  are identified. The prosthetic components appear well seated, and no acute fracture or dislocation is identified. Postoperative gas is noted in the knee joint and surrounding soft tissues, and skin staples and a wound VAC are in place. IMPRESSION: Interval right total knee arthroplasty without evidence of acute osseous abnormality. Electronically Signed   By: Sebastian Ache M.D.   On: 11/16/2020 16:08    Disposition: Discharge home with HHPT today.     Follow-up Information    Ronnette Juniper On 11/30/2020.   Specialties: Orthopedic Surgery, Emergency Medicine Why: @ 10:45 am and then PT to follow Appt Contact information: [address] [place] [address] [phone]              Signed: Meriel Pica PA-C 11/20/2020, 7:19 AM

## 2020-11-18 NOTE — Progress Notes (Signed)
   Subjective: 2 Days Post-Op Procedure(s) (LRB): TOTAL KNEE ARTHROPLASTY (Right) Patient reports pain as mild.   Patient is well, and has had no acute complaints or problems.  Methocarbamol resolved spasms behind the knee.  No longer complaining of posterior knee pain.  She attempted physical therapy this morning but blood pressure was running low.  She is not on any hypertensive medications.  She is not feeling dizziness or lightheadedness while sitting.  Has a history of vertigo but denies sensation of the room spinning or similar vertigo symptoms that she is experienced in the past. Denies any CP, SOB, ABD pain. We will continue therapy today.  Plan is to go Home after hospital stay.  Objective: Vital signs in last 24 hours: Temp:  [98 F (36.7 C)-98.8 F (37.1 C)] 98.5 F (36.9 C) (11/21 0752) Pulse Rate:  [94-104] 101 (11/21 0752) Resp:  [16-20] 18 (11/21 0752) BP: (113-155)/(57-78) 113/61 (11/21 0752) SpO2:  [93 %-100 %] 96 % (11/21 0752)  Intake/Output from previous day: 11/20 0701 - 11/21 0700 In: 1197.5 [I.V.:1197.5] Out: 1950 [Urine:1950] Intake/Output this shift: No intake/output data recorded.  Recent Labs    11/16/20 1710 11/17/20 0644  HGB 11.8* 10.4*   Recent Labs    11/16/20 1710 11/17/20 0644  WBC 9.2 6.4  RBC 4.22 3.69*  HCT 36.5 31.9*  PLT 258 224   Recent Labs    11/16/20 1710 11/17/20 0644  NA  --  137  K  --  4.3  CL  --  103  CO2  --  25  BUN  --  11  CREATININE 0.64 0.86  GLUCOSE  --  135*  CALCIUM  --  8.8*   No results for input(s): LABPT, INR in the last 72 hours.  EXAM General - Patient is Alert, Appropriate and Oriented Extremity - Neurologically intact Neurovascular intact Sensation intact distally Intact pulses distally No cellulitis present Compartment soft Dressing - dressing C/D/I and no drainage, Praveena intact without drainage Motor Function - intact, moving foot and toes well on exam.   Past Medical History:   Diagnosis Date  . Arthritis   . Bladder spasms   . Complication of anesthesia   . Headache    H/O MIGRAINES  . Hypothyroidism   . IC (interstitial cystitis)   . Lipidemia   . PONV (postoperative nausea and vomiting)    X 2  . Vertigo    DUE TO ALLERGIES    Assessment/Plan:   2 Days Post-Op Procedure(s) (LRB): TOTAL KNEE ARTHROPLASTY (Right) Active Problems:   S/P TKR (total knee replacement) using cement, right  Estimated body mass index is 43.92 kg/m as calculated from the following:   Height as of this encounter: 5' (1.524 m).   Weight as of this encounter: 102 kg. Advance diet Up with therapy  Work on bowel movement Blood pressure running a little soft.  Will give 1 L of normal saline. Labs and vital signs are stable Pain well controlled Care manager to assist with discharge to home with home health PT likely Monday.  DVT Prophylaxis - Lovenox, TED hose and SCDs Weight-Bearing as tolerated to right leg   T. Cranston Neighbor, PA-C [facility] 11/18/2020, 9:26 AM

## 2020-11-19 ENCOUNTER — Encounter: Payer: Self-pay | Admitting: Orthopedic Surgery

## 2020-11-19 MED ORDER — SCOPOLAMINE 1 MG/3DAYS TD PT72
1.0000 | MEDICATED_PATCH | TRANSDERMAL | Status: DC
  Administered 2020-11-19: 1.5 mg via TRANSDERMAL
  Filled 2020-11-19: qty 1

## 2020-11-19 NOTE — Progress Notes (Signed)
Physical Therapy Treatment Patient Details Name: Stacey Henson MRN: [id number] DOB: 1956/01/11 Today's Date: 11/19/2020    History of Present Illness Pt is a 64 yo female diagnosed with osteoarthritis of the right knee and is s/p elective R TKA.  PMH includes L TKA, headaches, hypothyroidism, interstitial cystitis, bladder surgery, and vertigo.    PT Comments    Pt was pleasant and motivated to participate during the session and made good progress towards goals this session.  Pt was able to increase ambulation distance with grossly improved cadence and with decreased lean on the RW for support.  Pt was able to participate in stair training this session ascending 1 step initially and then 2 steps both times with bilateral rails and with control and stability grossly improved during the second attempt.  Pt reported no dizziness during amb or stair training but did endorse one very brief moment of dizziness while returning to sitting the immediately resolved.  Pt's BP, SpO2, and HR all WNL during the session.  Pt will benefit from HHPT services upon discharge to safely address deficits listed in patient problem list for decreased caregiver assistance and eventual return to PLOF.     Follow Up Recommendations  Home health PT;Supervision for mobility/OOB     Equipment Recommendations  None recommended by PT    Recommendations for Other Services       Precautions / Restrictions Precautions Precautions: Fall Precaution Booklet Issued: Yes (comment) Restrictions Weight Bearing Restrictions: Yes RLE Weight Bearing: Weight bearing as tolerated    Mobility  Bed Mobility Overal bed mobility: Needs Assistance Bed Mobility: Supine to Sit           General bed mobility comments: NT, pt in recliner  Transfers Overall transfer level: Needs assistance Equipment used: Rolling walker (2 wheeled) Transfers: Sit to/from Stand Sit to Stand: Min guard         General transfer  comment: Min verbal cues for sequencing  Ambulation/Gait Ambulation/Gait assistance: Min guard Gait Distance (Feet): 80 Feet Assistive device: Rolling walker (2 wheeled) Gait Pattern/deviations: Step-to pattern;Antalgic;Decreased stance time - right;Decreased step length - left;Trunk flexed Gait velocity: decreased   General Gait Details: Grossly improved cadence with no dizziness while walking this session.  Pt self-limited amb distance to save strength for stair training but could have ambulated further otherwise.   Stairs Stairs: Yes Stairs assistance: Min guard;+2 safety/equipment Stair Management: Two rails;Forwards;Backwards;Step to pattern Number of Stairs: 2 General stair comments: Pt able to ascend forward and descend backwards 1 step and then 2 steps with B rails with fair to good control and stability.   Wheelchair Mobility    Modified Rankin (Stroke Patients Only)       Balance Overall balance assessment: Needs assistance Sitting-balance support: Feet supported Sitting balance-Leahy Scale: Good     Standing balance support: Bilateral upper extremity supported;During functional activity Standing balance-Leahy Scale: Good Standing balance comment: Decreased lean on the RW for support this session                            Cognition Arousal/Alertness: Awake/alert Behavior During Therapy: WFL for tasks assessed/performed Overall Cognitive Status: Within Functional Limits for tasks assessed                                        Exercises Total Joint Exercises Ankle Circles/Pumps: AROM;Strengthening;Both;10  reps;15 reps Quad Sets: AROM;Strengthening;Right;10 reps;15 reps Gluteal Sets: Strengthening;Both;10 reps;15 reps Heel Slides: AROM;Right;10 reps Hip ABduction/ADduction: AAROM;10 reps;Right;5 reps Straight Leg Raises: AAROM;10 reps;Right;5 reps Long Arc Quad: AROM;Strengthening;10 reps;15 reps;Both Knee Flexion:  AROM;Strengthening;10 reps;15 reps;Both Goniometric ROM: R knee AROM: 11-73 deg Marching in Standing: AROM;Strengthening;Both;Standing;10 reps Other Exercises Other Exercises: Positioning review to promote R knee ext PROM Other Exercises: Stair training with verba cuingl and demonstration followed by pt practice    General Comments        Pertinent Vitals/Pain Pain Assessment: 0-10 Pain Score: 2  Pain Location: R knee Pain Descriptors / Indicators: Sore;Aching Pain Intervention(s): Premedicated before session;Monitored during session    Home Living                      Prior Function            PT Goals (current goals can now be found in the care plan section) Progress towards PT goals: Progressing toward goals    Frequency    BID      PT Plan Current plan remains appropriate    Co-evaluation              AM-PAC PT "6 Clicks" Mobility   Outcome Measure  Help needed turning from your back to your side while in a flat bed without using bedrails?: A Little Help needed moving from lying on your back to sitting on the side of a flat bed without using bedrails?: A Little Help needed moving to and from a bed to a chair (including a wheelchair)?: A Little Help needed standing up from a chair using your arms (e.g., wheelchair or bedside chair)?: A Little Help needed to walk in hospital room?: A Little Help needed climbing 3-5 steps with a railing? : A Little 6 Click Score: 18    End of Session Equipment Utilized During Treatment: Gait belt Activity Tolerance: Patient tolerated treatment well Patient left: with call bell/phone within reach;with SCD's reapplied;in chair;with chair alarm set;Other (comment) (Polar care donned to R knee) Nurse Communication: Mobility status;Weight bearing status PT Visit Diagnosis: Muscle weakness (generalized) (M62.81);Other abnormalities of gait and mobility (R26.89);Pain Pain - Right/Left: Right Pain - part of body:  Knee     Time: 1419-1500 PT Time Calculation (min) (ACUTE ONLY): 41 min  Charges:  $Gait Training: 23-37 mins $Therapeutic Exercise: 8-22 mins $Therapeutic Activity: 8-22 mins                     D. Scott Kissie Ziolkowski PT, DPT 11/19/20, 4:21 PM

## 2020-11-19 NOTE — Progress Notes (Signed)
Physical Therapy Treatment Patient Details Name: Stacey Henson MRN: [id number] DOB: [birth year] Today's Date: 11/19/2020    History of Present Illness Pt is a 64 yo female diagnosed with osteoarthritis of the right knee and is s/p elective R TKA.  PMH includes L TKA, headaches, hypothyroidism, interstitial cystitis, bladder surgery, and vertigo.    PT Comments    Pt was pleasant and motivated to participate during the session.  Pt required min A to manage her RLE during sup to sit.  Pt required no physical assistance with transfers but did need cuing for proper sequencing.  Pt was able to amb 15' with slow, antalgic gait pattern before requiring to return to sitting secondary to dizziness.  Pt's BP taken in sitting at 116/55 which was minimally higher than pt's earlier recorded BP with pt's symptoms resolving quickly upon sitting.  Pt was able to amb another 15' back to her chair with further amb deferred for pt safety, nsg notified.  Pt limited primarily by nausea and dizziness over the last several PT sessions with pt anticipated to make good progress towards goals once issues are resolved.  Pt will benefit from HHPT services upon discharge to safely address deficits listed in patient problem list for decreased caregiver assistance and eventual return to PLOF.     Follow Up Recommendations  Home health PT;Supervision for mobility/OOB     Equipment Recommendations  None recommended by PT    Recommendations for Other Services       Precautions / Restrictions Precautions Precautions: Fall Restrictions Weight Bearing Restrictions: Yes RLE Weight Bearing: Weight bearing as tolerated    Mobility  Bed Mobility Overal bed mobility: Needs Assistance Bed Mobility: Supine to Sit Sup to sit: Min A           General bed mobility comments: Min A for RLE control  Transfers Overall transfer level: Needs assistance Equipment used: Rolling walker (2 wheeled) Transfers: Sit  to/from Stand Sit to Stand: Min guard         General transfer comment: Min verbal cues for sequencing  Ambulation/Gait Ambulation/Gait assistance: Min guard Gait Distance (Feet): 15 Feet Assistive device: Rolling walker (2 wheeled) Gait Pattern/deviations: Step-to pattern;Antalgic;Decreased stance time - right;Decreased step length - left;Trunk flexed Gait velocity: decreased   General Gait Details: Amb distance limited by dizziness with BP taken in sitting at 116/55.   Stairs             Wheelchair Mobility    Modified Rankin (Stroke Patients Only)       Balance Overall balance assessment: Needs assistance Sitting-balance support: Feet supported Sitting balance-Leahy Scale: Good     Standing balance support: Bilateral upper extremity supported;During functional activity Standing balance-Leahy Scale: Fair Standing balance comment: Very dependent on RW, leans on R elbow for support                            Cognition Arousal/Alertness: Awake/alert Behavior During Therapy: WFL for tasks assessed/performed Overall Cognitive Status: Within Functional Limits for tasks assessed                                        Exercises Total Joint Exercises Ankle Circles/Pumps: AROM;Strengthening;Both;10 reps Quad Sets: AROM;Strengthening;Right;10 reps;15 reps Gluteal Sets: Strengthening;Both;10 reps Heel Slides: AROM;Right;10 reps Hip ABduction/ADduction: AAROM;10 reps;Right;5 reps Straight Leg Raises: AAROM;10 reps;Right;5 reps Long Arc  Quad: AROM;Strengthening;Right;10 reps;15 reps Knee Flexion: AROM;Strengthening;Right;10 reps;15 reps Goniometric ROM: R knee AROM: 11-73 deg Marching in Standing: AROM;Strengthening;Both;5 reps;Standing Other Exercises Other Exercises: Positioning review to promote R knee ext PROM Other Exercises: HEP eduction and review per handout    General Comments        Pertinent Vitals/Pain Pain Assessment:  0-10 Pain Score: 5  Pain Location: R knee Pain Descriptors / Indicators: Sore;Aching Pain Intervention(s): Premedicated before session;Monitored during session    Home Living                      Prior Function            PT Goals (current goals can now be found in the care plan section) Progress towards PT goals: Not progressing toward goals - comment (limited by dizziness in standing)    Frequency    BID      PT Plan Current plan remains appropriate    Co-evaluation              AM-PAC PT "6 Clicks" Mobility   Outcome Measure  Help needed turning from your back to your side while in a flat bed without using bedrails?: A Little Help needed moving from lying on your back to sitting on the side of a flat bed without using bedrails?: A Little Help needed moving to and from a bed to a chair (including a wheelchair)?: A Little Help needed standing up from a chair using your arms (e.g., wheelchair or bedside chair)?: A Little Help needed to walk in hospital room?: A Little Help needed climbing 3-5 steps with a railing? : A Lot 6 Click Score: 17    End of Session Equipment Utilized During Treatment: Gait belt Activity Tolerance: Treatment limited secondary to medical complications (Comment) (limited by dizziness in standing) Patient left: with call bell/phone within reach;with SCD's reapplied;in chair;with chair alarm set;Other (comment) (Polar care donned to R knee) Nurse Communication: Mobility status;Weight bearing status; pt dizzy in standing with BP WNL PT Visit Diagnosis: Muscle weakness (generalized) (M62.81);Other abnormalities of gait and mobility (R26.89);Pain Pain - Right/Left: Right Pain - part of body: Knee     Time: 0910-0955 PT Time Calculation (min) (ACUTE ONLY): 45 min  Charges:  $Gait Training: 8-22 mins $Therapeutic Exercise: 8-22 mins $Therapeutic Activity: 8-22 mins                     D. Scott Isobelle Tuckett PT, DPT 11/19/20, 1:57  PM

## 2020-11-19 NOTE — TOC Initial Note (Signed)
Transition of Care [facility]) - Initial/Assessment Note    Patient Details  Name: Stacey Henson MRN: [id number] Date of Birth: [birth year]  Transition of Care [facility]) CM/SW Contact:    Shelbie Ammons, RN Phone Number: 11/19/2020, 10:15 AM  Clinical Narrative:    RNCM met with patient at bedside. Patient sitting up getting ready to work with PT and reports she believes she is going home today. Patient is agreeable to home health services and reports she has already been contacted by [facility] and they are set up to come see her when she gets home. Patient reports that she already has all necessary equipment at home as well.                Expected Discharge Plan: [place] Barriers to Discharge: No Barriers Identified   Patient Goals and CMS Choice        Expected Discharge Plan and Services Expected Discharge Plan: [place] Choice: [place] arrangements for the past 2 months: Single Family Home Expected Discharge Date: 11/19/20                         HH Arranged: PT, OT HH Agency: [facility] (formerly [employer]) Date [place]: 11/19/20 Time HH Agency Contacted: 56 Representative spoke with at [place]: [place] Arrangements/Services Living arrangements for the past 2 months: [place] Lives with:: Self Patient language and need for interpreter reviewed:: Yes Do you feel safe going back to the place where you live?: Yes      Need for Family Participation in Patient Care: Yes (Comment) Care giver support system in place?: Yes (comment)   Criminal Activity/Legal Involvement Pertinent to Current Situation/Hospitalization: No - Comment as needed  Activities of Daily Living Home Assistive Devices/Equipment: Bedside commode/3-in-1, Walker (specify type) ADL Screening (condition at time of admission) Patient's cognitive ability adequate to safely  complete daily activities?: Yes Is the patient deaf or have difficulty hearing?: No Does the patient have difficulty seeing, even when wearing glasses/contacts?: No Does the patient have difficulty concentrating, remembering, or making decisions?: No Patient able to express need for assistance with ADLs?: Yes Does the patient have difficulty dressing or bathing?: No Independently performs ADLs?: Yes (appropriate for developmental age) Does the patient have difficulty walking or climbing stairs?: Yes Weakness of Legs: Right Weakness of Arms/Hands: None  Permission Sought/Granted                  Emotional Assessment Appearance:: Appears stated age Attitude/Demeanor/Rapport: Engaged Affect (typically observed): Appropriate Orientation: : Oriented to Situation, Oriented to  Time, Oriented to Place, Oriented to Self Alcohol / Substance Use: Not Applicable Psych Involvement: No (comment)  Admission diagnosis:  S/P TKR (total knee replacement) using cement, right [Z96.651] Patient Active Problem List   Diagnosis Date Noted  . S/P TKR (total knee replacement) using cement, right 11/16/2020  . Knee joint replacement status, left 05/24/2020   PCP:  Verita Lamb, NP Pharmacy:   [facility] [phone] - [address] [place] [address] Phone: [phone] Fax: [phone]     Social Determinants of Health (SDOH) Interventions    Readmission Risk Interventions No flowsheet data found.

## 2020-11-19 NOTE — Plan of Care (Signed)
  Problem: Education: Goal: Knowledge of General Education information will improve Description: Including pain rating scale, medication(s)/side effects and non-pharmacologic comfort measures Outcome: Progressing   Problem: Clinical Measurements: Goal: Will remain free from infection Outcome: Progressing   Problem: Elimination: Goal: Will not experience complications related to urinary retention Outcome: Progressing   Problem: Pain Managment: Goal: General experience of comfort will improve Outcome: Progressing   

## 2020-11-19 NOTE — Progress Notes (Addendum)
   Subjective: 3 Days Post-Op Procedure(s) (LRB): TOTAL KNEE ARTHROPLASTY (Right) Patient reports pain as mild.   Patient is well, and has had no acute complaints or problems.  Patient has had a little bit of low blood pressure, currently without any dizziness or lightheadedness. Denies any CP, SOB, ABD pain. We will continue therapy today.  Plan is to go Home after hospital stay.  Objective: Vital signs in last 24 hours: Temp:  [98.2 F (36.8 C)-98.6 F (37 C)] 98.2 F (36.8 C) (11/22 0832) Pulse Rate:  [102-106] 105 (11/22 0832) Resp:  [16-20] 20 (11/22 0832) BP: (98-147)/(45-83) 112/45 (11/22 0832) SpO2:  [93 %-97 %] 96 % (11/22 0832)  Intake/Output from previous day: 11/21 0701 - 11/22 0700 In: -  Out: 1200 [Urine:1200] Intake/Output this shift: No intake/output data recorded.  Recent Labs    11/16/20 1710 11/17/20 0644  HGB 11.8* 10.4*   Recent Labs    11/16/20 1710 11/17/20 0644  WBC 9.2 6.4  RBC 4.22 3.69*  HCT 36.5 31.9*  PLT 258 224   Recent Labs    11/16/20 1710 11/17/20 0644  NA  --  137  K  --  4.3  CL  --  103  CO2  --  25  BUN  --  11  CREATININE 0.64 0.86  GLUCOSE  --  135*  CALCIUM  --  8.8*   No results for input(s): LABPT, INR in the last 72 hours.  EXAM General - Patient is Alert, Appropriate and Oriented Extremity - Neurologically intact Neurovascular intact Sensation intact distally Intact pulses distally No cellulitis present Compartment soft Dressing - dressing C/D/I and no drainage, Praveena intact without drainage Motor Function - intact, moving foot and toes well on exam.   Past Medical History:  Diagnosis Date  . Arthritis   . Bladder spasms   . Complication of anesthesia   . Headache    H/O MIGRAINES  . Hypothyroidism   . IC (interstitial cystitis)   . Lipidemia   . PONV (postoperative nausea and vomiting)    X 2  . Vertigo    DUE TO ALLERGIES    Assessment/Plan:   3 Days Post-Op Procedure(s)  (LRB): TOTAL KNEE ARTHROPLASTY (Right) Active Problems:   S/P TKR (total knee replacement) using cement, right  Estimated body mass index is 43.92 kg/m as calculated from the following:   Height as of this encounter: 5' (1.524 m).   Weight as of this encounter: 102 kg. Advance diet Up with therapy  Work on bowel movement, milk of mag given today Blood pressure running a little soft.  Encourage p.o. fluids Labs and vital signs are stable.  Heart rate slightly elevated, no chest pain or shortness of breath Pain well controlled Care manager to assist with discharge to home with home health PT likely today pending good progress of physical therapy  DVT Prophylaxis - Lovenox, TED hose and SCDs Weight-Bearing as tolerated to right leg   T. Cranston Neighbor, PA-C [facility] 11/19/2020, 8:54 AM

## 2020-11-20 NOTE — Progress Notes (Signed)
Discharge summary reviewed with verbal understanding. Surgeon converted to prevena for discharge. All questions answered with understanding, all components verified present for discharge for polar care, and Rxs given

## 2020-11-20 NOTE — Progress Notes (Signed)
Subjective: 4 Days Post-Op Procedure(s) (LRB): TOTAL KNEE ARTHROPLASTY (Right) Patient reports pain as mild.   Patient is well, and has had no acute complaints or problems.   Denies any CP, SOB, ABD pain. We will continue therapy today.  Plan is to go Home after hospital stay. Patient has had a BM.  Objective: Vital signs in last 24 hours: Temp:  [98.2 F (36.8 C)-99.2 F (37.3 C)] 98.6 F (37 C) (11/23 0509) Pulse Rate:  [92-106] 92 (11/23 0509) Resp:  [16-20] 16 (11/23 0509) BP: (108-119)/(45-74) 111/61 (11/23 0509) SpO2:  [91 %-100 %] 100 % (11/23 0509)  Intake/Output from previous day: 11/22 0701 - 11/23 0700 In: 240 [P.O.:240] Out: 750 [Urine:750] Intake/Output this shift: No intake/output data recorded.  No results for input(s): HGB in the last 72 hours. No results for input(s): WBC, RBC, HCT, PLT in the last 72 hours. No results for input(s): NA, K, CL, CO2, BUN, CREATININE, GLUCOSE, CALCIUM in the last 72 hours. No results for input(s): LABPT, INR in the last 72 hours.  EXAM General - Patient is Alert, Appropriate and Oriented Extremity - Neurologically intact Neurovascular intact Sensation intact distally Intact pulses distally No cellulitis present Compartment soft Dressing - dressing C/D/I and no drainage, Praveena intact without drainage Motor Function - intact, moving foot and toes well on exam.  Negative Homans to bilateral lower extremities.  Past Medical History:  Diagnosis Date  . Arthritis   . Bladder spasms   . Complication of anesthesia   . Headache    H/O MIGRAINES  . Hypothyroidism   . IC (interstitial cystitis)   . Lipidemia   . PONV (postoperative nausea and vomiting)    X 2  . Vertigo    DUE TO ALLERGIES    Assessment/Plan:   4 Days Post-Op Procedure(s) (LRB): TOTAL KNEE ARTHROPLASTY (Right) Active Problems:   S/P TKR (total knee replacement) using cement, right  Estimated body mass index is 43.92 kg/m as calculated from the  following:   Height as of this encounter: 5' (1.524 m).   Weight as of this encounter: 102 kg. Advance diet Up with therapy   Patient has had a BM. Patient was able to clear stairs yesterday. No chest pain, SOB.  Vitals stable. Plan for discharge home today with HHPT.  DVT Prophylaxis - Lovenox, TED hose and SCDs Weight-Bearing as tolerated to right leg  J. Horris Latino, PA-C [facility] 11/20/2020, 7:17 AM

## 2020-11-20 NOTE — Progress Notes (Signed)
Physical Therapy Treatment Patient Details Name: Stacey Henson MRN: [id number] DOB: 01/09/1956 Today's Date: 11/20/2020    History of Present Illness Pt is a 64 yo female diagnosed with osteoarthritis of the right knee and is s/p elective R TKA.  PMH includes L TKA, headaches, hypothyroidism, interstitial cystitis, bladder surgery, and vertigo.    PT Comments    Pt was pleasant and motivated to participate during the session.  Pt was able to ascend and descend 4 steps with B rails with good control and stability with min cuing for sequencing.  Pt ambulated with good stability with antalgic step-to pattern but distance was limited secondary to pt having dizziness.  Pt returned to sitting with BP taken at 145/70 and with symptoms resolving quickly.  Pt making good progress towards functional goals and is primarily limited by dizziness at this point.  Pt will benefit from HHPT services upon discharge to safely address deficits listed in patient problem list for decreased caregiver assistance and eventual return to PLOF.     Follow Up Recommendations  Home health PT;Supervision for mobility/OOB     Equipment Recommendations  None recommended by PT    Recommendations for Other Services       Precautions / Restrictions Precautions Precautions: Fall Precaution Booklet Issued: Yes (comment) Restrictions Weight Bearing Restrictions: Yes RLE Weight Bearing: Weight bearing as tolerated    Mobility  Bed Mobility Overal bed mobility: Needs Assistance Bed Mobility: Supine to Sit     Supine to sit: Min assist     General bed mobility comments: Min A for RLE control  Transfers Overall transfer level: Needs assistance Equipment used: Rolling walker (2 wheeled) Transfers: Sit to/from Stand Sit to Stand: Min guard         General transfer comment: Extra effort to stand but no physical assistance needed  Ambulation/Gait Ambulation/Gait assistance: Min guard Gait  Distance (Feet): 30 Feet Assistive device: Rolling walker (2 wheeled) Gait Pattern/deviations: Step-to pattern;Antalgic;Decreased stance time - right;Decreased step length - left;Trunk flexed Gait velocity: decreased   General Gait Details: Amb distance limited by dizziness with BP taken in sitting at 145/70   Stairs Stairs: Yes Stairs assistance: Min guard Stair Management: Two rails;Forwards;Step to pattern Number of Stairs: 4 General stair comments: Pt able to ascend/descend 4 steps with B rails with min verbal cues for sequencing with good control and stability   Wheelchair Mobility    Modified Rankin (Stroke Patients Only)       Balance Overall balance assessment: Needs assistance Sitting-balance support: Feet supported Sitting balance-Leahy Scale: Good     Standing balance support: Bilateral upper extremity supported;During functional activity Standing balance-Leahy Scale: Good                              Cognition Arousal/Alertness: Awake/alert Behavior During Therapy: WFL for tasks assessed/performed Overall Cognitive Status: Within Functional Limits for tasks assessed                                        Exercises Total Joint Exercises Ankle Circles/Pumps: AROM;Strengthening;Both;10 reps;15 reps Quad Sets: AROM;Strengthening;Right;10 reps;15 reps Hip ABduction/ADduction: AAROM;10 reps;Right;5 reps Straight Leg Raises: AAROM;10 reps;Right;5 reps Long Arc Quad: AROM;Strengthening;10 reps;15 reps;Both Knee Flexion: AROM;Strengthening;10 reps;15 reps;Both Goniometric ROM: R knee AROM: 11-73 deg Marching in Standing: AROM;Strengthening;Both;Standing;10 reps Other Exercises Other Exercises: Positioning review to promote  R knee ext PROM Other Exercises: Stair training with verba cuingl and demonstration followed by pt practice    General Comments        Pertinent Vitals/Pain Pain Assessment: 0-10 Pain Score: 4  Pain  Location: R knee Pain Descriptors / Indicators: Sore;Aching Pain Intervention(s): Premedicated before session;Monitored during session    Home Living                      Prior Function            PT Goals (current goals can now be found in the care plan section) Progress towards PT goals: Progressing toward goals    Frequency    BID      PT Plan Current plan remains appropriate    Co-evaluation              AM-PAC PT "6 Clicks" Mobility   Outcome Measure  Help needed turning from your back to your side while in a flat bed without using bedrails?: A Little Help needed moving from lying on your back to sitting on the side of a flat bed without using bedrails?: A Little Help needed moving to and from a bed to a chair (including a wheelchair)?: A Little Help needed standing up from a chair using your arms (e.g., wheelchair or bedside chair)?: A Little Help needed to walk in hospital room?: A Little Help needed climbing 3-5 steps with a railing? : A Little 6 Click Score: 18    End of Session Equipment Utilized During Treatment: Gait belt Activity Tolerance: Patient tolerated treatment well Patient left: with call bell/phone within reach;with SCD's reapplied;in chair;with chair alarm set;Other (comment) (Polar care to R knee) Nurse Communication: Mobility status;Weight bearing status PT Visit Diagnosis: Muscle weakness (generalized) (M62.81);Other abnormalities of gait and mobility (R26.89);Pain Pain - Right/Left: Right Pain - part of body: Knee     Time: 9798-9211 PT Time Calculation (min) (ACUTE ONLY): 39 min  Charges:  $Gait Training: 8-22 mins $Therapeutic Exercise: 8-22 mins $Therapeutic Activity: 8-22 mins                     D. Scott Dajah Fischman PT, DPT 11/20/20, 1:18 PM

## 2021-11-05 IMAGING — CT CT KNEE*R* W/O CM
1 of 3 series · 7 of 14 positions shown, 9 images · non-contrast
Comparison: None.

CLINICAL DATA: Preop right knee replacement

EXAM:
CT OF THE RIGHT KNEE WITHOUT CONTRAST
TECHNIQUE: Multidetector CT imaging of the RIGHT knee was performed according
to the standard protocol. Multiplanar CT image reconstructions were
also generated.

[Series 8: axial st · axial · 0.39mm/px · z∈[+812,+1031]mm · 7 of 293 slices shown, 9 images]
[im 37/293  soft-tissue]
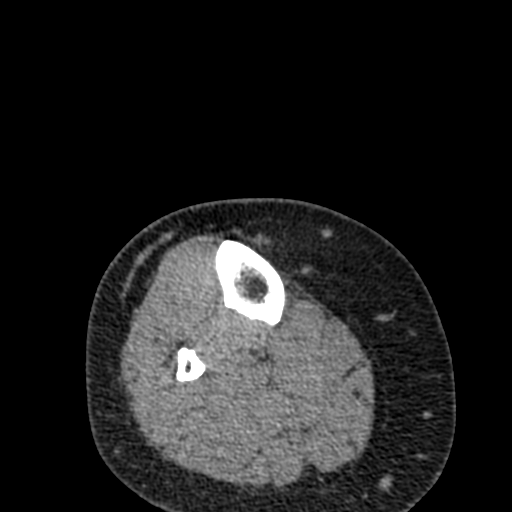
[im 37/293  bone]
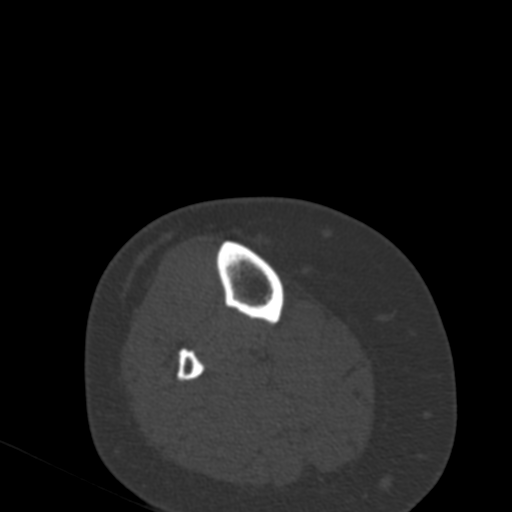
[im 74/293  bone]
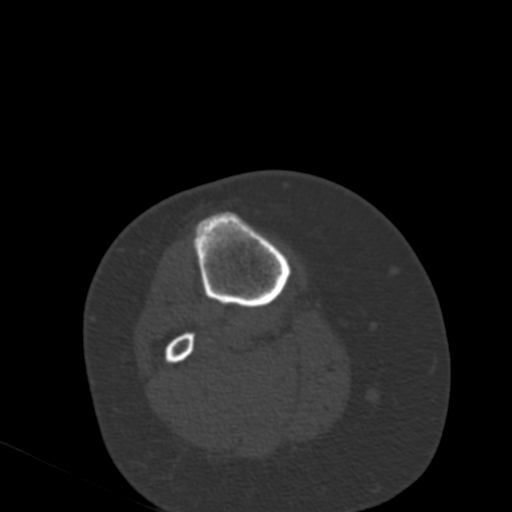
[im 110/293  bone]
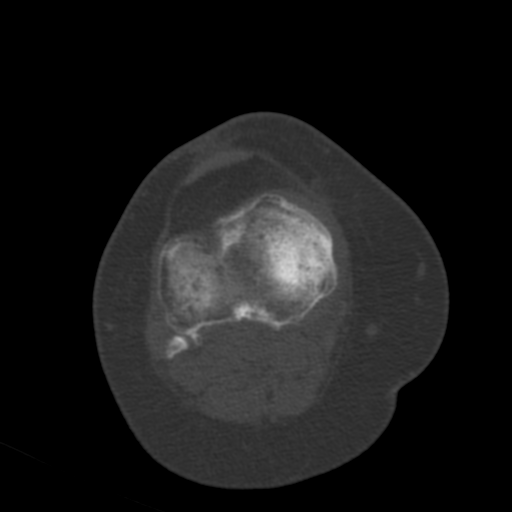
[im 147/293  bone]
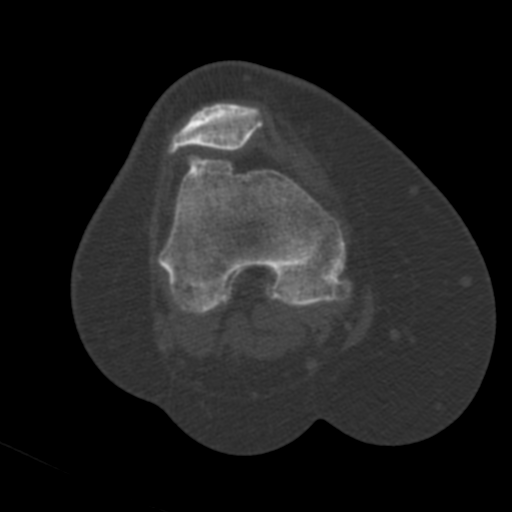
[im 183/293  soft-tissue]
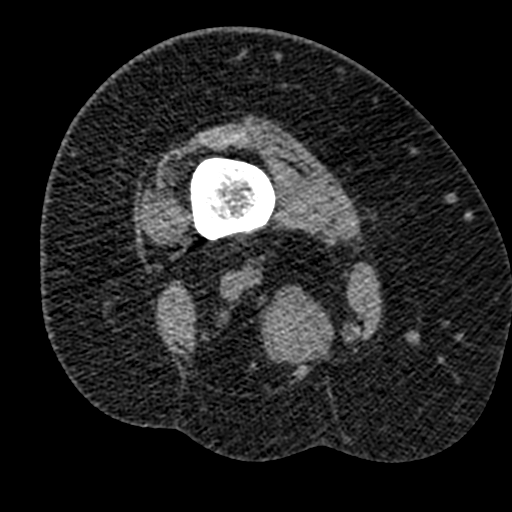
[im 183/293  bone]
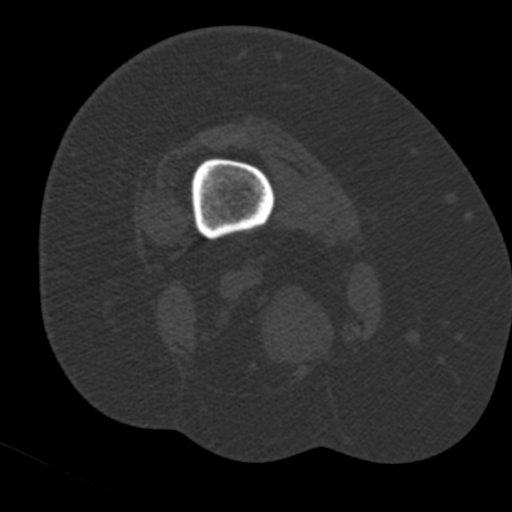
[im 220/293  bone]
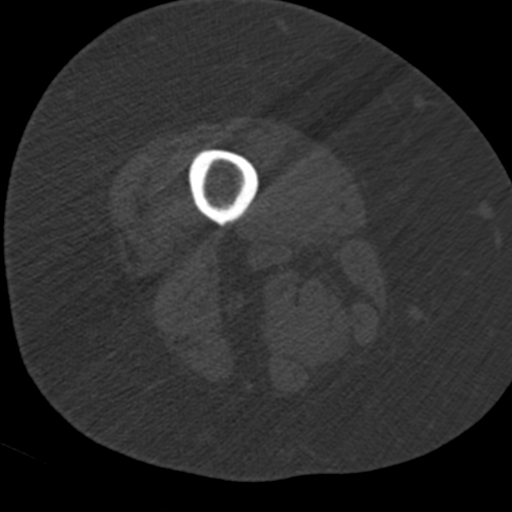
[im 256/293  bone]
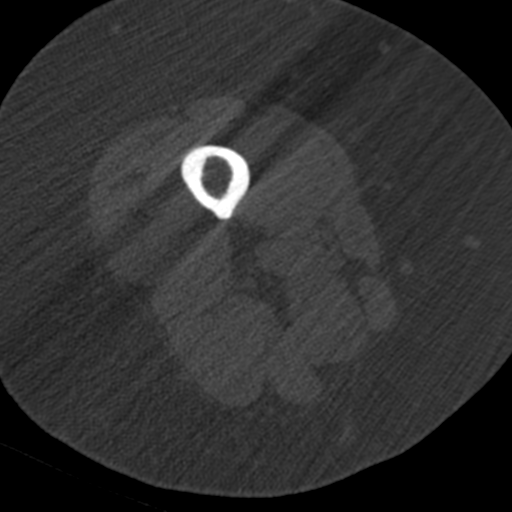

[7 of 14 positions shown; findings below may reference images not displayed]

FINDINGS: Bones/Joint/Cartilage

No fracture or dislocation. Normal alignment. No joint effusion.

The hip demonstrates no fracture or dislocation. There is no lytic
or blastic lesion. There is mild osteoarthritis of the right hip.

The knee demonstrates no fracture or dislocation. There is no lytic
or blastic lesion. There is severe osteoarthritis of the medial
femorotibial compartment with subchondral sclerosis, cystic changes
and marginal osteophytes. There is moderate osteoarthritis of the
lateral femorotibial compartment. There is moderate osteoarthritis
of the patellofemoral compartment. There is a small loose body in
the medial aspect of the lateral femorotibial compartment.

The ankle demonstrates no fracture or dislocation. There is no lytic
or blastic lesion.

Ligaments

Ligaments are suboptimally evaluated by CT.

Muscles and Tendons
Muscles are normal. No muscle atrophy. No intramuscular fluid
collection or hematoma.

Soft tissue
No fluid collection or hematoma.  No soft tissue mass.
IMPRESSION: 1. Tricompartmental osteoarthritis of the right knee most severe in
the medial femorotibial compartment.

## 2022-05-15 ENCOUNTER — Ambulatory Visit (INDEPENDENT_AMBULATORY_CARE_PROVIDER_SITE_OTHER): Payer: Commercial Managed Care - PPO

## 2022-05-15 ENCOUNTER — Ambulatory Visit
Admission: EM | Admit: 2022-05-15 | Discharge: 2022-05-15 | Disposition: A | Discharge status: Home / Self Care | Payer: Commercial Managed Care - PPO | Attending: Physician Assistant | Admitting: Physician Assistant

## 2022-05-15 ENCOUNTER — Other Ambulatory Visit: Payer: Self-pay

## 2022-05-15 ENCOUNTER — Encounter: Payer: Self-pay | Admitting: *Deleted

## 2022-05-15 DIAGNOSIS — M25531 Pain in right wrist: Secondary | ICD-10-CM

## 2022-05-15 DIAGNOSIS — S62114A Nondisplaced fracture of triquetrum [cuneiform] bone, right wrist, initial encounter for closed fracture: Secondary | ICD-10-CM | POA: Diagnosis not present

## 2022-05-15 DIAGNOSIS — M79644 Pain in right finger(s): Secondary | ICD-10-CM

## 2022-05-15 DIAGNOSIS — S0990XA Unspecified injury of head, initial encounter: Secondary | ICD-10-CM | POA: Diagnosis not present

## 2022-05-15 DIAGNOSIS — M79674 Pain in right toe(s): Secondary | ICD-10-CM | POA: Diagnosis not present

## 2022-05-15 NOTE — Discharge Instructions (Addendum)
-  You do have a fracture of a small bone in your hand very close to your wrist.  We have placed a splint.  Leave it on and do not get it wet. - You should keep the extremity elevated.  You can take Tylenol for pain. - Try to follow-up with Ortho next week for evaluation. -Your toe is not broken. - Your neurological exam is normal today and this is very reassuring.  Continue to ice your head.  Try to rest and stay hydrated. - Look out for any red flag signs or symptoms and go to the ER if they occur.  If you were to develop any severe headache, dizziness, vomiting, feel faint or pass out, have balance or speech issues, feel confused, you will need to call 911 or have someone take you to the ER.  You have a condition requiring you to follow up with Orthopedics so please call one of the following office for appointment:   [facility] [address] [place], [place] [address] Phone: [phone]  [facility] [address] [place], [place] [address] Phone: [phone]

## 2022-05-15 NOTE — ED Triage Notes (Signed)
Pt reports a trip and fall today. Pt has a bruise over Rt eye brow and forehead. Pt has Rt wrist pain and rt 3rd toes pain.

## 2022-05-15 NOTE — ED Provider Notes (Signed)
[facility]    CSN: [id number] Arrival date & time: 05/15/22  1848      History   Chief Complaint Chief Complaint  Patient presents with   Fall   Wrist Pain    HPI Stacey Parkeratricia Caulder Orvan Henson is a 66 y.o. female presenting for injuries following accidental fall.  Patient says that she lost her balance and her heels and fell face forward and sorted onto the right side.  Patient says that she hit cement.  She denies loss of consciousness.  She does report that she has some pain, swelling and bruising about the right orbit.  She denies any change in her vision.  She also reports right wrist pain.  She says that hurts the most.  Reports does not really hurt too bad when it is touched but it is very painful when she moves the wrist.  Additionally she reports pain and swelling as well as bruising of the third toe of the right foot.  Patient has been applying ice to her head.  She denies any nausea or vomiting.  Reports that she does not have a headache and the head is only painful when the area of swelling is touched.  Of note she is not taking any anticoagulants.  No other concerns.  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   Knee joint replacement status, left 05/24/2020    Past Surgical History:  Procedure Laterality Date   ABDOMINAL HYSTERECTOMY     APPENDECTOMY     BLADDER SURGERY     X2   INTERSTIM IMPLANT PLACEMENT     BLADDER-DOES NOT WORK DUE TO BATTERY   RECTOVAGINAL FISTULA CLOSURE     TOTAL KNEE ARTHROPLASTY Left 05/24/2020   Procedure: LEFT TOTAL KNEE ARTHROPLASTY;  Surgeon: Kennedy BuckerMenz, Michael, MD;   Location: [facility];  Service: Orthopedics;  Laterality: Left;   TOTAL KNEE ARTHROPLASTY Right 11/16/2020   Procedure: TOTAL KNEE ARTHROPLASTY;  Surgeon: Kennedy BuckerMenz, Michael, MD;  Location: [facility];  Service: Orthopedics;  Laterality: Right;  REQUESTING RNFA    OB History   No obstetric history on file.      Home Medications    Prior to Admission medications   Medication Sig Start Date End Date Taking? Authorizing Provider  acetaminophen (TYLENOL) 500 MG tablet Take 500-1,000 mg by mouth every 6 (six) hours as needed (for pain/knee pain.).    [provider]  amitriptyline (ELAVIL) 50 MG tablet Take 50 mg by mouth at bedtime.  01/25/19   [provider]  atorvastatin (LIPITOR) 40 MG tablet Take 40 mg by mouth daily.     [provider]  Cholecalciferol (VITAMIN D3) 125 MCG (5000 UT) TABS Take 5,000 Units by mouth daily.    [provider]  Cranberry-Vitamin C (AZO CRANBERRY URINARY TRACT) 250-60 MG CAPS Take 2 capsules by mouth daily.    [provider]  cyclobenzaprine (FLEXERIL) 5 MG tablet Take 5 mg by mouth 2 (two) times daily as needed for muscle spasms.    [provider]  diphenhydrAMINE (BENADRYL) 25 MG tablet Take 25 mg by mouth 3 (three) times daily as needed for allergies.     [provider]  diphenhydramine-acetaminophen (TYLENOL PM) 25-500 MG TABS tablet Take 1 tablet by mouth at bedtime as needed (pain/sleep).    [provider]  docusate sodium (COLACE) 100 MG capsule Take 1 capsule (100 mg total) by mouth 2 (two) times daily. 11/18/20   Evon Slack, PA-C  enoxaparin (LOVENOX) 40 MG/0.4ML injection Inject 0.4 mLs (40 mg total) into the skin daily for 14 days. 11/18/20 12/02/20  Evon Slack, PA-C  levothyroxine (SYNTHROID, LEVOTHROID) 50 MCG tablet Take 50 mcg by mouth daily before breakfast.    [provider]  methocarbamol (ROBAXIN) 750 MG tablet Take 1 tablet (750 mg total) by mouth every 6  (six) hours as needed for muscle spasms. 11/18/20   Evon Slack, PA-C  Multiple Vitamin (MULTIVITAMIN WITH MINERALS) TABS tablet Take 1 tablet by mouth daily. Women's One-A-Day    [provider]  oxyCODONE (OXY IR/ROXICODONE) 5 MG immediate release tablet Take 1-2 tablets (5-10 mg total) by mouth every 4 (four) hours as needed for moderate pain (pain score 4-6). 11/18/20   Evon Slack, PA-C  Potassium 99 MG TABS Take 99 mg by mouth at bedtime.    [provider]  traMADol (ULTRAM) 50 MG tablet Take 1 tablet (50 mg total) by mouth every 6 (six) hours. 11/18/20   Evon Slack, PA-C  zolpidem (AMBIEN) 10 MG tablet Take 10 mg by mouth at bedtime.     [provider]  loratadine (CLARITIN) 10 MG tablet Take 1 tablet (10 mg total) by mouth daily. Take 1 tablet in the morning. As needed for itching. Patient not taking: Reported on 05/09/2020 05/15/15 08/26/20  Hassan Rowan, MD  ranitidine (ZANTAC) 150 MG capsule Take 1 capsule (150 mg total) by mouth 2 (two) times daily. Patient not taking: Reported on 05/09/2020 05/15/15 08/26/20  Hassan Rowan, MD    Family History Family History  Problem Relation Age of Onset   Cancer Mother     Social History Social History   Tobacco Use   Smoking status: Never   Smokeless tobacco: Never  Vaping Use   Vaping Use: Never used  Substance Use Topics   Alcohol use: No   Drug use: No     Allergies   Morphine and related   Review of Systems Review of Systems  Constitutional:  Negative for fatigue.  Eyes:  Negative for photophobia and visual disturbance.  Gastrointestinal:  Negative for nausea and vomiting.  Musculoskeletal:  Positive for arthralgias and joint swelling. Negative for back pain, neck pain and neck stiffness.  Skin:  Positive for color change and wound.  Neurological:  Negative for dizziness, syncope, facial asymmetry, speech difficulty, weakness, light-headedness, numbness and headaches.   Psychiatric/Behavioral:  Negative for confusion.     Physical Exam Triage Vital Signs ED Triage Vitals  Enc Vitals Group     BP 05/15/22 1901 134/87     Pulse Rate 05/15/22 1901 97     Resp 05/15/22 1901 18     Temp 05/15/22 1901 98.7 F (37.1 C)     Temp src --      SpO2 05/15/22 1901 97 %     Weight --      Height --      Head Circumference --      Peak Flow --  Pain Score 05/15/22 1859 8     Pain Loc --      Pain Edu? --      Excl. in GC? --    No data found.  Updated Vital Signs BP 134/87   Pulse 97   Temp 98.7 F (37.1 C)   Resp 18   SpO2 97%      Physical Exam Vitals and nursing note reviewed.  Constitutional:      General: She is not in acute distress.    Appearance: Normal appearance. She is not ill-appearing or toxic-appearing.  HENT:     Head:     Comments: Mild edema, ecchymosis of right brow and frontal region. TTP along right brow. No step offs noted    Nose: Nose normal.     Mouth/Throat:     Mouth: Mucous membranes are moist.     Pharynx: Oropharynx is clear.  Eyes:     General: No scleral icterus.       Right eye: No discharge.        Left eye: No discharge.     Extraocular Movements: Extraocular movements intact.     Conjunctiva/sclera: Conjunctivae normal.     Pupils: Pupils are equal, round, and reactive to light.  Cardiovascular:     Rate and Rhythm: Normal rate and regular rhythm.     Heart sounds: Normal heart sounds.  Pulmonary:     Effort: Pulmonary effort is normal. No respiratory distress.     Breath sounds: Normal breath sounds.  Musculoskeletal:     Cervical back: Neck supple.     Comments: RIGHT WRIST/HAND: Mild swelling of distal wrist.  Mild tenderness palpation of the distal radius and ulnar carpal bones slightly, triquetrum, hamate.  Increased pain with flexion of wrist.  Reduced range of motion of wrist.  RIGHT FOOT/3RD TOE: Diffuse ecchymosis and swelling of the third toe with diffuse tenderness to palpation.   Painful range of motion.  Skin:    General: Skin is dry.  Neurological:     General: No focal deficit present.     Mental Status: She is alert and oriented to person, place, and time. Mental status is at baseline.     Cranial Nerves: No cranial nerve deficit.     Motor: No weakness.     Coordination: Coordination normal.     Gait: Gait normal.  Psychiatric:        Mood and Affect: Mood normal.        Behavior: Behavior normal.        Thought Content: Thought content normal.     UC Treatments / Results  Labs (all labs ordered are listed, but only abnormal results are displayed) Labs Reviewed - No data to display  EKG   Radiology DG Wrist Complete Right  Result Date: 05/15/2022 CLINICAL DATA:  Larey Seat, right wrist pain EXAM: RIGHT WRIST - COMPLETE 3+ VIEW COMPARISON:  None Available. FINDINGS: Frontal, oblique, and lateral views of the right wrist are obtained. There is a small fracture fragment dorsal to the proximal carpal row only seen on lateral view, likely related to the triquetrum. Mild overlying soft tissue swelling. No other acute bony abnormalities. Joint spaces are well preserved. IMPRESSION: 1. Small fracture off the dorsal aspect of the proximal carpal row, likely off of the triquetrum. 2. Dorsal soft tissue swelling. Electronically Signed   By: Sharlet Salina M.D.   On: 05/15/2022 19:27   DG Foot Complete Right  Result Date: 05/15/2022 CLINICAL DATA:  Tripped and fell, right third digit pain EXAM: RIGHT FOOT COMPLETE - 3+ VIEW COMPARISON:  08/16/2016 FINDINGS: Frontal, oblique, lateral views of the right foot are obtained. No acute displaced fracture, subluxation, or dislocation. Multifocal osteoarthritis throughout the right forefoot, greatest at the first metatarsophalangeal joint. Inferior calcaneal spur. Soft tissues are unremarkable. IMPRESSION: 1. No acute displaced fracture. 2. Progressive multifocal osteoarthritis greatest at the first MTP joint. Electronically Signed    By: Sharlet Salina M.D.   On: 05/15/2022 19:25    Procedures Procedures (including critical care time)  Medications Ordered in UC Medications - No data to display  Initial Impression / Assessment and Plan / UC Course  I have reviewed the triage vital signs and the nursing notes.  Pertinent labs & imaging results that were available during my care of the patient were reviewed by me and considered in my medical decision making (see chart for details).  66 year old female presenting for injuries due to sustained a fall about 6 hours ago.  Patient reports she excellently fell off of her shoe and hit her head, right hand/wrist and right foot.  Denies loss of consciousness.  No dizziness, fatigue, vomiting, confusion.  Has been applying ice to her head and vision normal.  Patient reports most concern for her right hand/wrist.  She believes there could be a possible fracture.  Vitals are stable.  She is overall well-appearing.  Her neurological exam including cranial nerve exam is within normal limits.  She does have mild swelling of the right brow and frontal region with slight ecchymosis.  Tenderness palpation along the right brow without obvious step-offs.  Tenderness to palpation of the wrist and ulnar aspect of the hand/carpal bones.  She also has swelling, ecchymosis and tenderness along the third digit of the right foot.  X-rays obtained of right wrist and right foot.  X-rays show probable triquetrum fracture.  She does have tenderness in this area.  Splint applied.  Advised to follow-up with orthopedics.  She states that she is gone to [place] [facility] [facility] in the past and plans to follow-up there early next week.  Reviewed RICE guidelines.  Tylenol for pain only.  X-ray of toe is normal.  I discussed care of head injury with patient.  Reviewed ED red flag signs and symptoms.  Her head injury appears to be minor.  Advise going to ED for any red flag signs or symptoms which include severe or worsening  headache, dizziness, vomiting, syncope or presyncope, balance or speech issues, confusion, etc.  Patient understanding and agreeable to plan.  I did discuss the limitations and capabilities we have an urgent care and if any other red flag signs and symptoms occur she will need to immediately go to the emergency department for CT.  Final Clinical Impressions(s) / UC Diagnoses   Final diagnoses:  Closed nondisplaced fracture of triquetrum of right wrist, initial encounter  Right wrist pain  Pain of toe of right foot  Injury of head, initial encounter     Discharge Instructions      -You do have a fracture of a small bone in your hand very close to your wrist.  We have placed a splint.  Leave it on and do not get it wet. - You should keep the extremity elevated.  You can take Tylenol for pain. - Try to follow-up with Ortho next week for evaluation. -Your toe is not broken. - Your neurological exam is normal today and this is very reassuring.  Continue to  ice your head.  Try to rest and stay hydrated. - Look out for any red flag signs or symptoms and go to the ER if they occur.  If you were to develop any severe headache, dizziness, vomiting, feel faint or pass out, have balance or speech issues, feel confused, you will need to call 911 or have someone take you to the ER.  You have a condition requiring you to follow up with Orthopedics so please call one of the following office for appointment:   [facility] [address] [place], [place] [address] Phone: [phone]  [facility] [address] [place], [place] [address] Phone: [phone]      ED Prescriptions   None    PDMP not reviewed this encounter.   Shirlee Latch, PA-C 05/15/22 1950

## 2024-01-23 ENCOUNTER — Encounter: Payer: Self-pay | Admitting: Emergency Medicine

## 2024-01-23 ENCOUNTER — Ambulatory Visit
Admission: EM | Admit: 2024-01-23 | Discharge: 2024-01-23 | Disposition: A | Discharge status: Home / Self Care | Payer: Medicare HMO | Attending: Emergency Medicine | Admitting: Emergency Medicine

## 2024-01-23 DIAGNOSIS — K120 Recurrent oral aphthae: Secondary | ICD-10-CM

## 2024-01-23 LAB — GROUP A STREP BY PCR: Group A Strep by PCR: NOT DETECTED

## 2024-01-23 MED ORDER — LIDOCAINE VISCOUS HCL 2 % MT SOLN: 5.0000 mL | Freq: Four times a day (QID) | OROMUCOSAL | 0 refills | Status: DC | PRN

## 2024-01-23 MED ORDER — DEBACTEROL 30-50 % MT SOLN: 1.0000 | Freq: Once | OROMUCOSAL | 1 refills | Status: AC

## 2024-01-23 NOTE — Discharge Instructions (Addendum)
Aphthous ulcers are small, shallow ulcers that can appear inside the mouth.  They are typically caused by strep, acidic foods, or minor injuries.  They usually resolve on their own in 1 to 2 weeks.  Please use the Magic mouthwash, 15 mL swish and spit, before meals and at bedtime to help with pain.  You may also rinse with warm salt water to help soothe your mouth tissues and aid in pain relief.  Take over-the-counter Tylenol and/or ibuprofen according to package instructions as needed for pain relief.  Take the amino acid L-lysine, 3000 mg 3 times a day to prevent further outbreaks.  Dry the ulcer inside of your mouth using a cotton swab and apply the Debacterol.  Hold for 10 seconds and the pain should resolve.  The lesion should be replaced by healthy tissue in 3 to 5 days.

## 2024-01-23 NOTE — ED Triage Notes (Signed)
Patient c/o sore throat for over a week.  Patient unsure of fevers.

## 2024-01-23 NOTE — ED Provider Notes (Addendum)
[facility]    CSN: [id number] Arrival date & time: 01/23/24  0932      History   Chief Complaint Chief Complaint  Patient presents with   Sore Throat    HPI Stacey Henson is a 68 y.o. female.   HPI  68 year old female with a past medical history significant for vertigo, interstitial cystitis, hypothyroidism, hyperlipidemia, migraine headaches, arthritis, and bladder spasms presents for evaluation of a sore throat that is been present for over a week.  It is predominantly sore on the right-hand side.  No associated fever, runny nose, nasal congestion, or cough.  The patient reports that she did start to develop a headache yesterday.  Past Medical History:  Diagnosis Date   Arthritis    Bladder spasms    Complication of anesthesia    Headache    H/O MIGRAINES   Hypothyroidism    IC (interstitial cystitis)    Lipidemia    PONV (postoperative nausea and vomiting)    X 2   Vertigo    DUE TO ALLERGIES    Patient Active Problem List   Diagnosis Date Noted   S/P TKR (total knee replacement) using cement, right 11/16/2020   Knee joint replacement status, left 05/24/2020    Past Surgical History:  Procedure Laterality Date   ABDOMINAL HYSTERECTOMY     APPENDECTOMY     BLADDER SURGERY     X2   INTERSTIM IMPLANT PLACEMENT     BLADDER-DOES NOT WORK DUE TO BATTERY   RECTOVAGINAL FISTULA CLOSURE     TOTAL KNEE ARTHROPLASTY Left 05/24/2020   Procedure: LEFT TOTAL KNEE ARTHROPLASTY;  Surgeon: Kennedy Bucker, MD;  Location: [facility];  Service: Orthopedics;  Laterality: Left;   TOTAL KNEE ARTHROPLASTY Right 11/16/2020   Procedure: TOTAL KNEE ARTHROPLASTY;  Surgeon: Kennedy Bucker, MD;  Location: [facility];  Service: Orthopedics;  Laterality: Right;  REQUESTING RNFA    OB History   No obstetric history on file.      Home Medications    Prior to Admission medications   Medication Sig Start Date End Date Taking? Authorizing Provider  magic  mouthwash (lidocaine, diphenhydrAMINE, alum & mag hydroxide) suspension Swish and spit 5 mLs 4 (four) times daily as needed for mouth pain. 01/23/24  Yes Becky Augusta, NP  Sulfuric Acid-Sulf Phenolics (DEBACTEROL) 30-50 % SOLN Use as directed 1 Application in the mouth or throat once for 1 dose. Dry the ulcer with a clean Q-tip and apply 1 applicator swab to the ulcer and hold for 5 to 10 seconds. 01/23/24 01/23/24 Yes Becky Augusta, NP  acetaminophen (TYLENOL) 500 MG tablet Take 500-1,000 mg by mouth every 6 (six) hours as needed (for pain/knee pain.).    [provider]  amitriptyline (ELAVIL) 50 MG tablet Take 50 mg by mouth at bedtime.  01/25/19   [provider]  atorvastatin (LIPITOR) 40 MG tablet Take 40 mg by mouth daily.     [provider]  Cholecalciferol (VITAMIN D3) 125 MCG (5000 UT) TABS Take 5,000 Units by mouth daily.    [provider]  Cranberry-Vitamin C (AZO CRANBERRY URINARY TRACT) 250-60 MG CAPS Take 2 capsules by mouth daily.    [provider]  cyclobenzaprine (FLEXERIL) 5 MG tablet Take 5 mg by mouth 2 (two) times daily as needed for muscle spasms.    [provider]  diphenhydrAMINE (BENADRYL) 25 MG tablet Take 25 mg by mouth 3 (three) times daily as needed for allergies.  [provider]  diphenhydramine-acetaminophen (TYLENOL PM) 25-500 MG TABS tablet Take 1 tablet by mouth at bedtime as needed (pain/sleep).    [provider]  docusate sodium (COLACE) 100 MG capsule Take 1 capsule (100 mg total) by mouth 2 (two) times daily. 11/18/20   Evon Slack, PA-C  levothyroxine (SYNTHROID, LEVOTHROID) 50 MCG tablet Take 50 mcg by mouth daily before breakfast.    [provider]  methocarbamol (ROBAXIN) 750 MG tablet Take 1 tablet (750 mg total) by mouth every 6 (six) hours as needed for muscle spasms. 11/18/20   Evon Slack, PA-C  Multiple Vitamin (MULTIVITAMIN WITH MINERALS) TABS tablet Take 1  tablet by mouth daily. Women's One-A-Day    [provider]  traMADol (ULTRAM) 50 MG tablet Take 1 tablet (50 mg total) by mouth every 6 (six) hours. 11/18/20   Evon Slack, PA-C  zolpidem (AMBIEN) 10 MG tablet Take 10 mg by mouth at bedtime.     [provider]  loratadine (CLARITIN) 10 MG tablet Take 1 tablet (10 mg total) by mouth daily. Take 1 tablet in the morning. As needed for itching. Patient not taking: Reported on 05/09/2020 05/15/15 08/26/20  Hassan Rowan, MD  ranitidine (ZANTAC) 150 MG capsule Take 1 capsule (150 mg total) by mouth 2 (two) times daily. Patient not taking: Reported on 05/09/2020 05/15/15 08/26/20  Hassan Rowan, MD    Family History Family History  Problem Relation Age of Onset   Cancer Mother     Social History Social History   Tobacco Use   Smoking status: Never   Smokeless tobacco: Never  Vaping Use   Vaping status: Never Used  Substance Use Topics   Alcohol use: No   Drug use: No     Allergies   Morphine and codeine   Review of Systems Review of Systems  Constitutional:  Negative for fever.  HENT:  Positive for sore throat. Negative for congestion, ear pain and rhinorrhea.   Respiratory:  Negative for cough.   Neurological:  Positive for headaches.     Physical Exam Triage Vital Signs ED Triage Vitals  Encounter Vitals Group     BP 01/23/24 0947 (!) 159/79     Systolic BP Percentile --      Diastolic BP Percentile --      Pulse Rate 01/23/24 0947 94     Resp 01/23/24 0947 15     Temp 01/23/24 0947 98.4 F (36.9 C)     Temp Source 01/23/24 0947 Oral     SpO2 01/23/24 0947 98 %     Weight 01/23/24 0945 224 lb 13.9 oz (102 kg)     Height 01/23/24 0945 5' (1.524 m)     Head Circumference --      Peak Flow --      Pain Score 01/23/24 0945 6     Pain Loc --      Pain Education --      Exclude from Growth Chart --    No data found.  Updated Vital Signs BP (!) 159/79 (BP Location: Left Arm)   Pulse 94   Temp  98.4 F (36.9 C) (Oral)   Resp 15   Ht 5' (1.524 m)   Wt 224 lb 13.9 oz (102 kg)   SpO2 98%   BMI 43.92 kg/m   Visual Acuity Right Eye Distance:   Left Eye Distance:   Bilateral Distance:    Right Eye Near:   Left Eye Near:  Bilateral Near:     Physical Exam Vitals and nursing note reviewed.  Constitutional:      Appearance: Normal appearance. She is not ill-appearing.  HENT:     Head: Normocephalic and atraumatic.     Mouth/Throat:     Mouth: Mucous membranes are moist.     Pharynx: Posterior oropharyngeal erythema present.  Musculoskeletal:     Cervical back: Normal range of motion and neck supple. No tenderness.  Lymphadenopathy:     Cervical: No cervical adenopathy.  Neurological:     Mental Status: She is alert.      UC Treatments / Results  Labs (all labs ordered are listed, but only abnormal results are displayed) Labs Reviewed  GROUP A STREP BY PCR    EKG   Radiology No results found.  Procedures Procedures (including critical care time)  Medications Ordered in UC Medications - No data to display  Initial Impression / Assessment and Plan / UC Course  I have reviewed the triage vital signs and the nursing notes.  Pertinent labs & imaging results that were available during my care of the patient were reviewed by me and considered in my medical decision making (see chart for details).   Patient is a pleasant, nontoxic-appearing [age]-year-old female presenting for evaluation of sore throat as outlined HPI above.  Her physical exam reveals mild erythema bilateral tonsillar pillars without significant edema or exudate.  There is an aphthous ulcer at the superior aspect of her right tonsillar pillar.  No cervical lymphadenopathy appreciated on exam.  I suspect, given that patient sore throat is predominant on the right, that it is secondary to this aphthous ulcer but I will obtain a strep PCR to evaluate for the presence of strep.  Strep PCR is  negative.  I will discharge patient home with diagnosis of aphthous ulcer with a prescription for Debacterol.  I will also suggest that the patient use over-the-counter L-lysine, 3 g 3 times daily to help prevent further outbreaks.  I will also measure mouthwash that the patient can use before meals and at bedtime to help with pain   Final Clinical Impressions(s) / UC Diagnoses   Final diagnoses:  Oral aphthous ulcer     Discharge Instructions      Aphthous ulcers are small, shallow ulcers that can appear inside the mouth.  They are typically caused by strep, acidic foods, or minor injuries.  They usually resolve on their own in 1 to 2 weeks.  Please use the Magic mouthwash, 15 mL swish and spit, before meals and at bedtime to help with pain.  You may also rinse with warm salt water to help soothe your mouth tissues and aid in pain relief.  Take over-the-counter Tylenol and/or ibuprofen according to package instructions as needed for pain relief.  Take the amino acid L-lysine, 3000 mg 3 times a day to prevent further outbreaks.  Dry the ulcer inside of your mouth using a cotton swab and apply the Debacterol.  Hold for 10 seconds and the pain should resolve.  The lesion should be replaced by healthy tissue in 3 to 5 days.      ED Prescriptions     Medication Sig Dispense Auth. Provider   Sulfuric Acid-Sulf Phenolics (DEBACTEROL) 30-50 % SOLN Use as directed 1 Application in the mouth or throat once for 1 dose. Dry the ulcer with a clean Q-tip and apply 1 applicator swab to the ulcer and hold for 5 to 10 seconds. 12 each  Becky Augusta, NP   magic mouthwash (lidocaine, diphenhydrAMINE, alum & mag hydroxide) suspension Swish and spit 5 mLs 4 (four) times daily as needed for mouth pain. 360 mL Becky Augusta, NP      PDMP not reviewed this encounter.   Becky Augusta, NP 01/23/24 1025    Becky Augusta, NP 01/23/24 1026

## 2024-03-28 ENCOUNTER — Ambulatory Visit (INDEPENDENT_AMBULATORY_CARE_PROVIDER_SITE_OTHER): Payer: Medicare HMO | Admitting: Urology

## 2024-03-28 VITALS — BP 148/85 | HR 91 | Ht 60.0 in | Wt 212.0 lb

## 2024-03-28 DIAGNOSIS — N3010 Interstitial cystitis (chronic) without hematuria: Secondary | ICD-10-CM | POA: Diagnosis not present

## 2024-03-28 LAB — URINALYSIS, COMPLETE
Bilirubin, UA: NEGATIVE
Glucose, UA: NEGATIVE
Ketones, UA: NEGATIVE
Nitrite, UA: NEGATIVE
Protein,UA: NEGATIVE
RBC, UA: NEGATIVE
Specific Gravity, UA: <1.005 — ABNORMAL LOW (ref 1.005–1.030)
Urobilinogen, Ur: 0.2 mg/dL (ref 0.2–1.0)
pH, UA: 6 (ref 5.0–7.5)

## 2024-03-28 LAB — MICROSCOPIC EXAMINATION

## 2024-03-28 MED ORDER — GEMTESA 75 MG PO TABS: 75.0000 mg | ORAL_TABLET | Freq: Every day | ORAL | 11 refills | Status: AC

## 2024-03-28 MED ORDER — GEMTESA 75 MG PO TABS: 75.0000 mg | ORAL_TABLET | Freq: Every day | ORAL | Status: AC

## 2024-03-28 NOTE — Patient Instructions (Signed)

## 2024-03-28 NOTE — Progress Notes (Signed)
 03/28/2024 8:17 AM   Stacey Henson 17-Jun-1956 [id number]  Referring provider: Noralyn Pick, NP [address] [place],  [place] [address]  Chief Complaint  Patient presents with   Establish Care    possible new treatments for interstitial cystitis    HPI: I was consulted for nighttime frequency.  She voids every hour and every 60 to 90 minutes during the day.  She may have tried Detrol in the past.  Her InterStim device for interstitial cystitis has not been working for more than 5 years due to battery.  She was diagnosed with interstitial cystitis decades ago and has had a hydrodistention and DMSO therapy Elmiron and other treatments.  It does not appear that she gets any pain but sometimes she can void multiple times an hour during a flareup worse in the spring  No ankle edema.  Flow was good.  She generally feels empty she sometimes wears 1 pad a day since she can have urge incontinence she holds it too long but is generally continent.  She has had a hysterectomy  No history of bladder surgery kidney stones or recurrent urinary tract infections.  No neurologic issues.   PMH: Past Medical History:  Diagnosis Date   Arthritis    Bladder spasms    Complication of anesthesia    Headache    H/O MIGRAINES   Hypothyroidism    IC (interstitial cystitis)    Lipidemia    PONV (postoperative nausea and vomiting)    X 2   Vertigo    DUE TO ALLERGIES    Surgical History: Past Surgical History:  Procedure Laterality Date   ABDOMINAL HYSTERECTOMY     APPENDECTOMY     BLADDER SURGERY     X2   INTERSTIM IMPLANT PLACEMENT     BLADDER-DOES NOT WORK DUE TO BATTERY   RECTOVAGINAL FISTULA CLOSURE     TOTAL KNEE ARTHROPLASTY Left 05/24/2020   Procedure: LEFT TOTAL KNEE ARTHROPLASTY;  Surgeon: Kennedy Bucker, MD;  Location: [facility];  Service: Orthopedics;  Laterality: Left;   TOTAL KNEE ARTHROPLASTY Right 11/16/2020   Procedure: TOTAL KNEE ARTHROPLASTY;  Surgeon: Kennedy Bucker, MD;  Location: [facility];  Service: Orthopedics;  Laterality: Right;  REQUESTING RNFA    Home Medications:  Allergies as of 03/28/2024       Reactions   Morphine And Codeine Nausea And Vomiting   Projectile vomitting        Medication List        Accurate as of March 28, 2024  8:17 AM. If you have any questions, ask your nurse or doctor.          acetaminophen 500 MG tablet Commonly known as: TYLENOL Take 500-1,000 mg by mouth every 6 (six) hours as needed (for pain/knee pain.).   amitriptyline 50 MG tablet Commonly known as: ELAVIL Take 50 mg by mouth at bedtime.   atorvastatin 40 MG tablet Commonly known as: LIPITOR Take 40 mg by mouth daily.   AZO Cranberry Urinary Tract 250-60 MG Caps Generic drug: Cranberry-Vitamin C Take 2 capsules by mouth daily.   cyclobenzaprine 5 MG tablet Commonly known as: FLEXERIL Take 5 mg by mouth 2 (two) times daily as needed for muscle spasms.   diphenhydrAMINE 25 MG tablet Commonly known as: BENADRYL Take 25 mg by mouth 3 (three) times daily as needed for allergies.   diphenhydramine-acetaminophen 25-500 MG Tabs tablet Commonly known as: TYLENOL PM Take 1 tablet by mouth at bedtime as needed (  pain/sleep).   docusate sodium 100 MG capsule Commonly known as: COLACE Take 1 capsule (100 mg total) by mouth 2 (two) times daily.   levothyroxine 50 MCG tablet Commonly known as: SYNTHROID Take 50 mcg by mouth daily before breakfast.   magic mouthwash (lidocaine, diphenhydrAMINE, alum & mag hydroxide) suspension Swish and spit 5 mLs 4 (four) times daily as needed for mouth pain.   metFORMIN 500 MG tablet Commonly known as: GLUCOPHAGE Take 500 mg by mouth daily.   methocarbamol 750 MG tablet Commonly known as: ROBAXIN Take 1 tablet (750 mg total) by mouth every 6 (six) hours as needed for muscle spasms.   Mobic 15 MG tablet Generic drug: meloxicam Take 1 tablet every day by oral route.   multivitamin with  minerals Tabs tablet Take 1 tablet by mouth daily. Women's One-A-Day   traMADol 50 MG tablet Commonly known as: ULTRAM Take 1 tablet (50 mg total) by mouth every 6 (six) hours.   Vitamin D3 125 MCG (5000 UT) Tabs Take 5,000 Units by mouth daily.   zolpidem 10 MG tablet Commonly known as: AMBIEN Take 10 mg by mouth at bedtime.        Allergies:  Allergies  Allergen Reactions   Morphine And Codeine Nausea And Vomiting    Projectile vomitting    Family History: Family History  Problem Relation Age of Onset   Cancer Mother     Social History:  reports that she has never smoked. She has never used smokeless tobacco. She reports that she does not drink alcohol and does not use drugs.  ROS:                                        Physical Exam: There were no vitals taken for this visit.  Constitutional:  Alert and oriented, No acute distress. HEENT: [place] AT, moist mucus membranes.  Trachea midline, no masses.   Laboratory Data: Lab Results  Component Value Date   WBC 6.4 11/17/2020   HGB 10.4 (L) 11/17/2020   HCT 31.9 (L) 11/17/2020   MCV 86.4 11/17/2020   PLT 224 11/17/2020    Lab Results  Component Value Date   CREATININE 0.86 11/17/2020    No results found for: "PSA"  No results found for: "TESTOSTERONE"  No results found for: "HGBA1C"  Urinalysis    Component Value Date/Time   COLORURINE AMBER (A) 11/05/2020 1038   APPEARANCEUR CLEAR (A) 11/05/2020 1038   APPEARANCEUR CLEAR 01/02/2014 1227   LABSPEC 1.020 11/05/2020 1038   LABSPEC 1.025 01/02/2014 1227   PHURINE 5.0 11/05/2020 1038   GLUCOSEU NEGATIVE 11/05/2020 1038   GLUCOSEU NEGATIVE 01/02/2014 1227   HGBUR NEGATIVE 11/05/2020 1038   BILIRUBINUR NEGATIVE 11/05/2020 1038   BILIRUBINUR NEGATIVE 01/02/2014 1227   KETONESUR NEGATIVE 11/05/2020 1038   PROTEINUR NEGATIVE 11/05/2020 1038   NITRITE NEGATIVE 11/05/2020 1038   LEUKOCYTESUR NEGATIVE 11/05/2020 1038    LEUKOCYTESUR TRACE 01/02/2014 1227    Pertinent Imaging: Urine reviewed.  Urine sent for culture.  Chart reviewed  Assessment & Plan: Multiple causes of nighttime frequency discussed.  Reassess in 6 weeks for pelvic examination and cystoscopy on Gemtesa samples and prescription.  Proceed accordingly.  Does not appear that replacing the InterStim would be a good option  1. IC (interstitial cystitis) (Primary)  - Urinalysis, Complete   No follow-ups on file.  Martina Sinner, MD    [facility] [address] [place], [place] [address] [phone]

## 2024-03-31 LAB — CULTURE, URINE COMPREHENSIVE

## 2024-04-13 ENCOUNTER — Telehealth: Payer: Self-pay | Admitting: Urology

## 2024-04-13 DIAGNOSIS — N3010 Interstitial cystitis (chronic) without hematuria: Secondary | ICD-10-CM

## 2024-04-13 NOTE — Telephone Encounter (Signed)
 Patient called stating that the samples of Gemtesa that she was given is not helping her. Patient said that she has not picked up her RX from the pharmacy because she would like to know if a different RX can be sent in place of the [place]. Please advise patient.

## 2024-04-14 NOTE — Telephone Encounter (Signed)
 Will inform Dr.Macdiarmid. will let pt know about any about medication changes.

## 2024-04-18 MED ORDER — MIRABEGRON ER 50 MG PO TB24: 50.0000 mg | ORAL_TABLET | Freq: Every day | ORAL | 11 refills | Status: AC

## 2024-04-18 NOTE — Addendum Note (Signed)
 Addended byKatina Parlor on: 04/18/2024 02:05 PM   Modules accepted: Orders

## 2024-04-18 NOTE — Telephone Encounter (Signed)
 Per Dr.MacDiarmid, myrbetriq  50mg  was sent in. Pt informed, meds sent. pt voiced understanding.

## 2024-06-06 ENCOUNTER — Other Ambulatory Visit: Admitting: Urology
